# Patient Record
Sex: Female | Born: 1985 | Race: Black or African American | Hispanic: No | Marital: Married | Smoking: Never smoker
Health system: Southern US, Community
[De-identification: ages and names within clinical notes are randomized; demographics above are authoritative.]

## PROBLEM LIST (undated history)

## (undated) ENCOUNTER — Inpatient Hospital Stay (HOSPITAL_COMMUNITY): Payer: Self-pay

## (undated) DIAGNOSIS — K529 Noninfective gastroenteritis and colitis, unspecified: Secondary | ICD-10-CM

## (undated) DIAGNOSIS — O24429 Gestational diabetes mellitus in childbirth, unspecified control: Secondary | ICD-10-CM

## (undated) DIAGNOSIS — O24419 Gestational diabetes mellitus in pregnancy, unspecified control: Secondary | ICD-10-CM

## (undated) DIAGNOSIS — B999 Unspecified infectious disease: Secondary | ICD-10-CM

## (undated) DIAGNOSIS — F329 Major depressive disorder, single episode, unspecified: Secondary | ICD-10-CM

## (undated) DIAGNOSIS — Z8619 Personal history of other infectious and parasitic diseases: Secondary | ICD-10-CM

## (undated) DIAGNOSIS — F32A Depression, unspecified: Secondary | ICD-10-CM

## (undated) DIAGNOSIS — O139 Gestational [pregnancy-induced] hypertension without significant proteinuria, unspecified trimester: Secondary | ICD-10-CM

## (undated) DIAGNOSIS — R51 Headache: Secondary | ICD-10-CM

## (undated) HISTORY — PX: NO PAST SURGERIES: SHX2092

## (undated) HISTORY — DX: Personal history of other infectious and parasitic diseases: Z86.19

## (undated) HISTORY — DX: Morbid (severe) obesity due to excess calories: E66.01

## (undated) HISTORY — DX: Major depressive disorder, single episode, unspecified: F32.9

## (undated) HISTORY — DX: Gestational diabetes mellitus in pregnancy, unspecified control: O24.419

## (undated) HISTORY — DX: Depression, unspecified: F32.A

---
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
## 2005-01-16 ENCOUNTER — Emergency Department (HOSPITAL_COMMUNITY): Admission: EM | Admit: 2005-01-16 | Discharge: 2005-01-16 | Payer: Self-pay | Admitting: Family Medicine

## 2005-01-18 ENCOUNTER — Emergency Department (HOSPITAL_COMMUNITY): Admission: EM | Admit: 2005-01-18 | Discharge: 2005-01-18 | Payer: Self-pay | Admitting: Family Medicine

## 2006-02-26 ENCOUNTER — Other Ambulatory Visit: Admission: RE | Admit: 2006-02-26 | Discharge: 2006-02-26 | Payer: Self-pay | Admitting: Obstetrics and Gynecology

## 2010-02-08 ENCOUNTER — Ambulatory Visit: Payer: Self-pay

## 2010-02-14 ENCOUNTER — Inpatient Hospital Stay (HOSPITAL_COMMUNITY)
Admission: AD | Admit: 2010-02-14 | Discharge: 2010-02-14 | Disposition: A | Discharge status: Home / Self Care | Payer: 59 | Source: Ambulatory Visit | Attending: Obstetrics and Gynecology | Admitting: Obstetrics and Gynecology

## 2010-02-14 DIAGNOSIS — O4700 False labor before 37 completed weeks of gestation, unspecified trimester: Secondary | ICD-10-CM | POA: Insufficient documentation

## 2010-02-14 LAB — URINALYSIS, ROUTINE W REFLEX MICROSCOPIC
Bilirubin Urine: NEGATIVE
Hgb urine dipstick: NEGATIVE
Ketones, ur: 15 mg/dL — AB
Nitrite: NEGATIVE
Protein, ur: NEGATIVE mg/dL
Specific Gravity, Urine: 1.02 (ref 1.005–1.030)
Urine Glucose, Fasting: NEGATIVE mg/dL
Urobilinogen, UA: 0.2 mg/dL (ref 0.0–1.0)
pH: 6.5 (ref 5.0–8.0)

## 2010-02-14 LAB — FETAL FIBRONECTIN: Fetal Fibronectin: NEGATIVE

## 2010-02-15 LAB — GC/CHLAMYDIA PROBE AMP, GENITAL
Chlamydia, DNA Probe: NEGATIVE
GC Probe Amp, Genital: NEGATIVE

## 2010-02-20 ENCOUNTER — Ambulatory Visit: Payer: Self-pay | Admitting: *Deleted

## 2010-03-08 ENCOUNTER — Encounter: Payer: 59 | Attending: Obstetrics and Gynecology

## 2010-03-08 DIAGNOSIS — Z713 Dietary counseling and surveillance: Secondary | ICD-10-CM | POA: Insufficient documentation

## 2010-03-08 DIAGNOSIS — O99810 Abnormal glucose complicating pregnancy: Secondary | ICD-10-CM | POA: Insufficient documentation

## 2010-04-01 ENCOUNTER — Inpatient Hospital Stay (HOSPITAL_COMMUNITY)
Admission: AD | Admit: 2010-04-01 | Discharge: 2010-04-03 | DRG: 775 | Disposition: A | Discharge status: Home / Self Care | Payer: 59 | Source: Ambulatory Visit | Attending: Obstetrics and Gynecology | Admitting: Obstetrics and Gynecology

## 2010-04-01 ENCOUNTER — Other Ambulatory Visit: Payer: Self-pay | Admitting: Obstetrics and Gynecology

## 2010-04-01 DIAGNOSIS — E669 Obesity, unspecified: Secondary | ICD-10-CM | POA: Diagnosis present

## 2010-04-01 DIAGNOSIS — O99814 Abnormal glucose complicating childbirth: Secondary | ICD-10-CM | POA: Diagnosis present

## 2010-04-01 DIAGNOSIS — O99214 Obesity complicating childbirth: Secondary | ICD-10-CM | POA: Diagnosis present

## 2010-04-01 LAB — CBC
HCT: 38.7 % (ref 36.0–46.0)
Hemoglobin: 13.4 g/dL (ref 12.0–15.0)
MCH: 28.3 pg (ref 26.0–34.0)
MCHC: 34.6 g/dL (ref 30.0–36.0)
MCV: 81.8 fL (ref 78.0–100.0)
RBC: 4.73 MIL/uL (ref 3.87–5.11)
RDW: 14.7 % (ref 11.5–15.5)
WBC: 12.5 10*3/uL — ABNORMAL HIGH (ref 4.0–10.5)

## 2010-04-01 LAB — PLATELET COUNT: Platelets: 178 10*3/uL (ref 150–400)

## 2010-04-01 LAB — ABO/RH: ABO/RH(D): O POS

## 2010-04-01 LAB — RPR: RPR Ser Ql: NONREACTIVE

## 2010-04-02 LAB — CBC
MCH: 27.6 pg (ref 26.0–34.0)
MCHC: 33.2 g/dL (ref 30.0–36.0)
Platelets: 206 10*3/uL (ref 150–400)
RDW: 15 % (ref 11.5–15.5)

## 2010-04-03 LAB — GLUCOSE, CAPILLARY: Glucose-Capillary: 130 mg/dL — ABNORMAL HIGH (ref 70–99)

## 2010-04-09 ENCOUNTER — Inpatient Hospital Stay (HOSPITAL_COMMUNITY): Admission: AD | Admit: 2010-04-09 | Payer: Self-pay | Source: Home / Self Care | Admitting: Obstetrics and Gynecology

## 2010-04-13 NOTE — Discharge Summary (Signed)
Natalie [name], Natalie [name]                ACCOUNT NO.:  [id number]  MEDICAL RECORD NO.:  [id number]           PATIENT TYPE:  I  LOCATION:  9145                          FACILITY:  [facility]  PHYSICIAN:  Malachi Pro. Ambrose Mantle, M.D. DATE OF BIRTH:  [birth year]  DATE OF ADMISSION:  04/01/2010 DATE OF DISCHARGE:  04/03/2010                              DISCHARGE SUMMARY   A 25 year old black female para 0 gravida 1, EDC April 10, 2010, by ultrasound admitted in labor.  Blood group and type O positive, negative antibody, rubella immune, RPR nonreactive, urine culture negative, hepatitis B surface antigen negative, HIV negative, GC and Chlamydia negative, hemoglobin AA, first trimester screen negative, CF declined, 3- hour GTT 100, 181, 170, and 137, group B strep negative.  Ultrasound on August 29, 2009, estimated gestational age [redacted] weeks and 0 days, [facility] April 10, 2010.  Repeat ultrasound on November 08, 2009, estimated gestational age [redacted] weeks 6 days, [facility] April 12, 2010.  Prenatal care was uncomplicated except for massive obesity and gestational diabetes mellitus that was diet-controlled.  She began contracting at 4 a.m. on the day of admission that became significantly painful at 10 a.m. cervix was 2 cm on the office and 4 cm in Maternity Admissions Unit.  PAST MEDICAL HISTORY:  No known allergies.  No operations.  Illnesses, migraines and obesity.  Alcohol, tobacco, and drugs none.  FAMILY HISTORY:  Father with an MI, coronary artery disease, and CVA. Uncle and aunt have diabetes and uncle has high blood pressure.  Mother with a thyroid problem.  Maternal grandmother with ovarian cancer.  PHYSICAL EXAMINATION ON ADMISSION:  VITAL SIGNS:  Normal except for pulse of 116. HEART:  Normal sinus tachycardia.  No murmurs. LUNGS:  Clear to auscultation. ABDOMEN:  Soft.  Fundal height has been 40 cm on March 28, 2010.  Fetal heart tones were in the 160s.  Cervix 5 cm, 100%, vertex at -2. Artificial rupture of  membranes produced thick, dark meconium-stained fluid.  IMPRESSION ON ADMISSION: 1. Intrauterine pregnancy at 38 weeks and 6 days. 2. Meconium-stained fluid. 3. Gestational diabetes mellitus. 4. Fetal tachycardia.  The patient requested an epidural.  It was difficult to get blood from the patient and consequently, there was a long delay in discovering that no platelet count could be done.  A second blood draw was done but the patient progressed rapidly.  She delivered before she could be placed in stirrups.  The infant was a 6-pound and 3-ounce female with Apgars of 8 at 1 and 9 at 5 minutes, and I delivered the baby, the baby smelled foul.  The nose and pharynx were suctioned with bulb on the patient's bed.  Dr. Mikle Bosworth, the neonatologist, was present at 1 minute of age. Placenta was removed intact.  Uterus was normal.  Bilateral lacerations at 3 and 9 o'clock on the inner labia were bleeding and sutured with 3-0 Vicryl under local block.  Blood loss about 400 mL.  The baby was observed in the regular nursery.  Postpartum, the patient did well.  She never became febrile.  The baby did well.  Remained  in the patient's room and on the second postpartum day, the patient was ready for discharge.  Capillary blood glucose was done and was 130.  Initial hemoglobin 13.4, hematocrit 38.7, white count 12,500, platelet count 178,000.  Followup hemoglobin 11.9, blood group and type O positive, RPR nonreactive.  FINAL DIAGNOSES: 1. Intrauterine pregnancy at 38 weeks and 6 days, delivered, vertex. 2. Meconium-stained fluid. 3. Gestational diabetes mellitus. 4. Fetal tachycardia. 5. Evidence of chorioamnionitis at birth.  OPERATIONS: 1. Spontaneous delivery, vertex. 2. Repair of bilateral labial lacerations at 3 and 9 o'clock.  FINAL CONDITION:  Improved.  INSTRUCTIONS:  Include our regular discharge instruction booklet.  The patient was given Motrin 600 mg, 30 tablets, 1 every 6 hours as  needed for pain with 1 refill.  She declines contraception at discharge and is advised to return in 6 weeks for followup examination.     Malachi Pro. Ambrose Mantle, M.D.     TFH/MEDQ  D:  04/03/2010  T:  04/04/2010  Job:  [id number]  Electronically Signed by Tracey Harries M.D. on 04/13/2010 08:50:37 AM

## 2012-01-09 NOTE — L&D Delivery Note (Signed)
Delivery Note  First Stage: Labor onset: 1000 Augmentation : AROM Analgesia /Anesthesia intrapartum: nubaine AROM at 1600, clear  Second Stage: Complete dilation at 2155 Onset of pushing at 2155 FHR second stage 145 baseline/ moderate variability/ no accels/ variables with pushing  Delivery of a viable female infant at 2217 by CNM in LOA position No nuchal cord Cord double clamped after cessation of pulsation, cut by patient's mother Cord blood sample collected    Third Stage: Placenta delivered spontaneously intact with 3 VC @ 2245 Placenta disposition: Intact, Schultz Uterine tone firm U/U / bleeding light  No laceration identified  Anesthesia for repair: N/A Repair N/A Est. Blood Loss (mL): 150  Complications: None  Mom to postpartum.  Baby to Couplet care / Skin to Skin.  Newborn: Birth Weight: 3555 g (7 lb 13.4 oz) Apgar Scores: 7 / 9 Feeding planned: Breast  Cyndee Brightly SNM 11/26/2012, 10:51 PM  Direct supervision for birth - Marlinda Mike CNM [facility]

## 2012-04-15 LAB — OB RESULTS CONSOLE ANTIBODY SCREEN: Antibody Screen: NEGATIVE

## 2012-04-15 LAB — OB RESULTS CONSOLE ABO/RH

## 2012-04-15 LAB — OB RESULTS CONSOLE HEPATITIS B SURFACE ANTIGEN: Hepatitis B Surface Ag: NEGATIVE

## 2012-04-15 LAB — OB RESULTS CONSOLE HIV ANTIBODY (ROUTINE TESTING): HIV: NONREACTIVE

## 2012-04-15 LAB — OB RESULTS CONSOLE RUBELLA ANTIBODY, IGM: Rubella: IMMUNE

## 2012-08-12 ENCOUNTER — Other Ambulatory Visit: Payer: Self-pay

## 2012-08-14 ENCOUNTER — Other Ambulatory Visit (HOSPITAL_COMMUNITY): Payer: Self-pay | Admitting: Obstetrics and Gynecology

## 2012-08-14 DIAGNOSIS — O10919 Unspecified pre-existing hypertension complicating pregnancy, unspecified trimester: Secondary | ICD-10-CM

## 2012-08-20 ENCOUNTER — Ambulatory Visit (HOSPITAL_COMMUNITY)
Admission: RE | Admit: 2012-08-20 | Discharge: 2012-08-20 | Disposition: A | Discharge status: Home / Self Care | Payer: 59 | Source: Ambulatory Visit | Attending: Obstetrics and Gynecology | Admitting: Obstetrics and Gynecology

## 2012-08-20 ENCOUNTER — Encounter (HOSPITAL_COMMUNITY): Payer: Self-pay

## 2012-08-20 DIAGNOSIS — Z363 Encounter for antenatal screening for malformations: Secondary | ICD-10-CM | POA: Insufficient documentation

## 2012-08-20 DIAGNOSIS — O10019 Pre-existing essential hypertension complicating pregnancy, unspecified trimester: Secondary | ICD-10-CM | POA: Insufficient documentation

## 2012-08-20 DIAGNOSIS — E669 Obesity, unspecified: Secondary | ICD-10-CM | POA: Insufficient documentation

## 2012-08-20 DIAGNOSIS — O162 Unspecified maternal hypertension, second trimester: Secondary | ICD-10-CM

## 2012-08-20 DIAGNOSIS — Z1389 Encounter for screening for other disorder: Secondary | ICD-10-CM | POA: Insufficient documentation

## 2012-08-20 DIAGNOSIS — E119 Type 2 diabetes mellitus without complications: Secondary | ICD-10-CM

## 2012-08-20 DIAGNOSIS — O358XX0 Maternal care for other (suspected) fetal abnormality and damage, not applicable or unspecified: Secondary | ICD-10-CM | POA: Insufficient documentation

## 2012-08-20 DIAGNOSIS — O10919 Unspecified pre-existing hypertension complicating pregnancy, unspecified trimester: Secondary | ICD-10-CM

## 2012-08-20 DIAGNOSIS — O24919 Unspecified diabetes mellitus in pregnancy, unspecified trimester: Secondary | ICD-10-CM | POA: Insufficient documentation

## 2012-08-20 NOTE — Progress Notes (Signed)
[facility] ultrasound  Indication: 27 yr old G2P1001 at [redacted]w[redacted]d with type II diabetes and chronic hypertension for fetal anatomic survey.  Findings: 1. Single intrauterine pregnancy. 2. Estimated fetal weight is in the 38th%. 3. Anterior placenta without evidence of previa. 4. Normal amniotic fluid volume. 5. Normal transabdominal cervical length. 6. The anatomy survey is extremely limited by maternal body habitus; no abnormalities were seen.  Recommendations: 1. Appropriate fetal growth. 2. Limited anatomy survey: - recommend follow up in 4 weeks to reattempt anatomic survey 3. Type II diabetes: - see consult letter - recommend fetal echocardiogram asap (scheduled) - recommend fetal growth every 4 weeks - recommend antenatal testing starting at [redacted] weeks gestation - recommend delivery by estimated due date 4. Hypertension: - see consult letter - on labetalol - recommend fetal growth and antenatal testing as above - recommend delivery by estimated due date but not prior to 38 weeks in the absence of other complications  Eulis Foster, MD

## 2012-08-20 NOTE — Consult Note (Signed)
MFM consult  27 yr old G2P1001 at [redacted]w[redacted]d with type II diabetes and chronic hypertension referred by Dr. Cherly Hensen for fetal anatomic survey and consult.  Past OB hx: full term vaginal delivery; complicated by gestational diabetes  PMH: type II diabetes- diagnosed early this pregnancy with elevated hgbA1C Chronic hypertension- diagnosed early this pregnancy  Ultrasound today shows: estimated fetal weight is in the 38th%. Anterior placenta without evidence of previa. Normal amniotic fluid volume. Normal transabdominal cervical length. The anatomy survey is extremely limited by maternal body habitus; no abnormalities were seen.  I discussed the findings of the ultrasound with the patient and counseled her as follows:  1. Appropriate fetal growth. 2. Limited anatomy survey: - recommend follow up in 4 weeks to reattempt anatomic survey - discussed limitations of ultrasound in detecting fetal anomalies and that we may not be able to clear anatomy given maternal body habitus but will reattempt 3. Type II diabetes: Discussed increased risks in pregnancy include: fetal macrosomia, shoulder dystocia, and increased risk of requiring a Cesarean delivery. There is also an increased risk of developing preeclampsia during the pregnancy. There is an increased risk of congenital malformations related to level of diabetic control in the first trimester. I discussed there is an increased risk of stillbirth, neonatal hypoglycemia, neonatal jaundice, and neonatal electrolyte disturbances. I recommend strict glucose control maintaining fasting blood sugars <90 and 2 hour postprandial values <120 or 1 hour values <140. Recommend patient check blood sugars 4x/day- fasting and 1 or 2 hours postprandial (currently is only checking fasting levels- patient reports values range from 80-100s). Patient is on glyburide 2.5mg  bid. Discussed needs to report her blood sugars weekly and medication should be adjusted to achieve the  above control. I recommend starting fetal kick counts- at [redacted] weeks gestation. I recommend starting antenatal testing with either weekly biophysical profiles or twice weekly nonstress tests and weekly amniotic fluid index starting at 32 weeks.  I recommend following fetal growth every 4 weeks. Patient had normal baseline 24 hour urine, CBC, and LFTs. Recommend check hemoglobin A1C every trimester.  Given the increased risk of congenital anomalies recommend fetal echocardiogram as soon as possible- the referral was made. The anatomy survey is limited on today's exam- recommend follow up in 4 weeks to complete anatomy. - recommend delivery by estimated due date 4. Hypertension: I discussed that women with preexistent hypertension are at increased risk of adverse pregnancy outcome, including preeclampsia, abruption, fetal growth restriction, and perinatal death. The risk increases with severity of hypertension and presence of end-organ damage. Risk of superimposed preeclampsia is 10 to 25 %; risk of abruption is 0.7 to 1.5 %; and risk of fetal growth restriction is 8 to 16 %. I also discussed that risk of preterm delivery is increased and is usually iatrogenic from the complications listed above. Risk of preterm delivery in women with chronic hypertension is 12-34%. There is also an increased risk of requiring C section for the above reasons. Patient' hypertension is currently managed with labetalol. We discussed this is a category C medication and is considered safe in pregnancy but may have an increased risk of fetal growth restriction. I would recommend targeting therapy to keep systolic blood pressures <150 and diastolic blood pressures <100. Patient had normal baseline 24 hour urine, CBC, and LFTs. I recommend serial ultrasounds for fetal growth every 4 weeks. I recommend close surveillance for the development of signs/symptoms of preeclampsia especially in the third trimester. I recommend antenatal  testing to start at  [redacted] weeks gestation. - recommend delivery by estimated due date but not prior to 38 weeks in the absence of other complications  I spent 30 minutes in face to face consultation with the patient in addition to time spent on the ultrasound.  Eulis Foster, MD

## 2012-08-27 DIAGNOSIS — IMO0002 Reserved for concepts with insufficient information to code with codable children: Secondary | ICD-10-CM | POA: Insufficient documentation

## 2012-09-17 ENCOUNTER — Ambulatory Visit (HOSPITAL_COMMUNITY): Payer: 59

## 2012-11-25 ENCOUNTER — Encounter (HOSPITAL_COMMUNITY): Payer: Self-pay | Admitting: *Deleted

## 2012-11-25 ENCOUNTER — Telehealth (HOSPITAL_COMMUNITY): Payer: Self-pay | Admitting: *Deleted

## 2012-11-25 NOTE — Telephone Encounter (Signed)
Preadmission screen  

## 2012-11-26 ENCOUNTER — Inpatient Hospital Stay (HOSPITAL_COMMUNITY)
Admission: AD | Admit: 2012-11-26 | Discharge: 2012-11-28 | DRG: 774 | Disposition: A | Discharge status: Home / Self Care | Payer: 59 | Source: Ambulatory Visit | Attending: Obstetrics & Gynecology | Admitting: Obstetrics & Gynecology

## 2012-11-26 ENCOUNTER — Encounter (HOSPITAL_COMMUNITY): Payer: Self-pay | Admitting: *Deleted

## 2012-11-26 DIAGNOSIS — O1002 Pre-existing essential hypertension complicating childbirth: Secondary | ICD-10-CM | POA: Diagnosis present

## 2012-11-26 DIAGNOSIS — I10 Essential (primary) hypertension: Secondary | ICD-10-CM | POA: Diagnosis present

## 2012-11-26 DIAGNOSIS — O24429 Gestational diabetes mellitus in childbirth, unspecified control: Secondary | ICD-10-CM | POA: Diagnosis present

## 2012-11-26 DIAGNOSIS — O4100X0 Oligohydramnios, unspecified trimester, not applicable or unspecified: Secondary | ICD-10-CM | POA: Diagnosis present

## 2012-11-26 DIAGNOSIS — E669 Obesity, unspecified: Secondary | ICD-10-CM | POA: Diagnosis present

## 2012-11-26 DIAGNOSIS — O99814 Abnormal glucose complicating childbirth: Principal | ICD-10-CM | POA: Diagnosis present

## 2012-11-26 HISTORY — DX: Gestational diabetes mellitus in childbirth, unspecified control: O24.429

## 2012-11-26 LAB — TYPE AND SCREEN
ABO/RH(D): O POS
Antibody Screen: NEGATIVE

## 2012-11-26 LAB — COMPREHENSIVE METABOLIC PANEL
ALT: 8 U/L (ref 0–35)
AST: 18 U/L (ref 0–37)
Albumin: 2.8 g/dL — ABNORMAL LOW (ref 3.5–5.2)
Alkaline Phosphatase: 101 U/L (ref 39–117)
BUN: 5 mg/dL — ABNORMAL LOW (ref 6–23)
CO2: 16 mEq/L — ABNORMAL LOW (ref 19–32)
Calcium: 9.9 mg/dL (ref 8.4–10.5)
Chloride: 105 mEq/L (ref 96–112)
Creatinine, Ser: 0.63 mg/dL (ref 0.50–1.10)
GFR calc Af Amer: >90 mL/min (ref >90)
GFR calc non Af Amer: >90 mL/min (ref >90)
Glucose, Bld: 88 mg/dL (ref 70–99)
Potassium: 4 mEq/L (ref 3.5–5.1)
Sodium: 133 mEq/L — ABNORMAL LOW (ref 135–145)
Total Bilirubin: 0.4 mg/dL (ref 0.3–1.2)
Total Protein: 6.7 g/dL (ref 6.0–8.3)

## 2012-11-26 LAB — CBC
HCT: 35.4 % — ABNORMAL LOW (ref 36.0–46.0)
Hemoglobin: 12.1 g/dL (ref 12.0–15.0)
MCH: 27.8 pg (ref 26.0–34.0)
MCHC: 34.2 g/dL (ref 30.0–36.0)
MCV: 81.4 fL (ref 78.0–100.0)
Platelets: 161 10*3/uL (ref 150–400)
RBC: 4.35 MIL/uL (ref 3.87–5.11)
RDW: 15.6 % — ABNORMAL HIGH (ref 11.5–15.5)
WBC: 7.6 10*3/uL (ref 4.0–10.5)

## 2012-11-26 LAB — RPR: RPR Ser Ql: NONREACTIVE

## 2012-11-26 MED ORDER — OXYTOCIN 10 UNIT/ML IJ SOLN
10.0000 [IU] | Freq: Once | INTRAMUSCULAR | Status: AC
  Administered 2012-11-26: 10 [IU] via INTRAMUSCULAR
  Filled 2012-11-26: qty 1

## 2012-11-26 MED ORDER — LIDOCAINE HCL (PF) 1 % IJ SOLN
30.0000 mL | INTRAMUSCULAR | Status: DC | PRN
Start: 2012-11-26 — End: 2012-11-28
  Filled 2012-11-26 (×2): qty 30

## 2012-11-26 MED ORDER — CITRIC ACID-SODIUM CITRATE 334-500 MG/5ML PO SOLN: 30.0000 mL | ORAL | Status: DC | PRN

## 2012-11-26 MED ORDER — ACETAMINOPHEN 325 MG PO TABS: 650.0000 mg | ORAL_TABLET | ORAL | Status: DC | PRN

## 2012-11-26 MED ORDER — NALBUPHINE HCL 10 MG/ML IJ SOLN
10.0000 mg | INTRAMUSCULAR | Status: AC
  Administered 2012-11-26: 10 mg via SUBCUTANEOUS
  Filled 2012-11-26: qty 1

## 2012-11-26 MED ORDER — IBUPROFEN 600 MG PO TABS: 600.0000 mg | ORAL_TABLET | Freq: Four times a day (QID) | ORAL | Status: DC | PRN

## 2012-11-26 NOTE — Progress Notes (Signed)
Baby girl Natalie [name] blood sugar 38 taken after one hour of birth. Baby to mothers breast. Called nursery to inform of baby girl Natalie [name] blood sugar

## 2012-11-26 NOTE — Progress Notes (Signed)
Delivery Note  First Stage: Labor onset: 1000 Augmentation : AROM Analgesia /Anesthesia intrapartum: nubaine AROM at 1600, clear  Second Stage: Complete dilation at 2155 Onset of pushing at 2155 FHR second stage 145 baseline/ moderate variability/ no accels/ variables with pushing  Delivery of a viable female infant at 2217 by CNM in LOA position No nuchal cord Cord double clamped after cessation of pulsation, cut by patient's mother Cord blood sample collected    Third Stage: Placenta delivered spontaneously intact with 3 VC @ 2245 Placenta disposition: Intact, Schultz Uterine tone firm U/U / bleeding light  No laceration identified  Anesthesia for repair: N/A Repair N/A Est. Blood Loss (mL): 150  Complications: None  Mom to postpartum.  Baby to Couplet care / Skin to Skin.  Newborn: Birth Weight: 3555 g (7 lb 13.4 oz) Apgar Scores: 7 / 9 Feeding planned: Breast  Cyndee Brightly SNM 11/26/2012, 10:51 PM

## 2012-11-26 NOTE — H&P (Signed)
  OB ADMISSION/ HISTORY & PHYSICAL:  Admission Date: 11/26/2012  2:56 PM  Admit Diagnosis: 39.4 weeks / labor / GDMa2  well -controlled / hypertension  Natalie [name] is a 27 y.o. female presenting for onset of labor and oligohydramnios at office  Prenatal History: G2P1001   EDC : 11/29/2012, by Ultrasound  Prenatal care at [facility]  Primary Ob Provider: Marlinda Mike CNM Prenatal course complicated by morbid obesity / hypertension / GDMa2   EFW 6-7 pounds / AFI 5.0 / BPP 8-8 / vtx  Prenatal Labs: ABO, Rh: O/Positive/-- (04/08 0000) Antibody: Negative (04/08 0000) Rubella: Immune (04/08 0000)  RPR: Nonreactive (04/08 0000)  HBsAg: Negative (04/08 0000)  HIV: Non-reactive (04/08 0000)  GTT: ABNORMAL GBS: Negative (10/27 0000)   Medical / Surgical History :  Past medical history:  Past Medical History  Diagnosis Date  . Depression   . Morbid obesity   . Hx of varicella   . Hypertension   . Gestational diabetes      Past surgical history:  Past Surgical History  Procedure Laterality Date  . No past surgeries      Family History:  Family History  Problem Relation Age of Onset  . Hypothyroidism Mother   . Asthma Mother   . Bipolar disorder Mother   . Stroke Father   . Hypertension Father   . Seizures Father   . Asthma Sister   . Asthma Brother   . Asthma Daughter   . Diabetes Maternal Uncle   . Diabetes Maternal Grandmother   . Cancer Maternal Grandmother     breast  . Heart disease Paternal Grandfather   . Cancer Paternal Grandfather   . Heart attack Paternal Grandfather      Social History:  reports that she has never smoked. She has never used smokeless tobacco. She reports that she does not drink alcohol or use illicit drugs.  Allergies: Review of patient's allergies indicates no known allergies.   Current Medications at time of admission:  Prior to Admission medications   Not on File    Review of Systems: Active FM onset  of ctx @ 0300 currently every 3 minutes No LOF bloody show pink discharge  Physical Exam:  VS: Blood pressure 122/82, pulse 131, temperature 98.2 F (36.8 C), temperature source Oral, resp. rate 20, height 5\' 2"  (1.575 m), weight 146.965 kg (324 lb), last menstrual period 02/15/2012.  General: alert and oriented, appears uncomfortable with ctx Heart: RRR Lungs: Clear lung fields Abdomen: Gravid, soft and non-tender, non-distended / uterus: gravid and non-tender Extremities: trace edema  Genitalia / VE: Dilation: 5 Effacement (%): 90 Station: -1 Exam by:: Dezi Brauner, cnm  FHR: baseline rate 155 / variability moderate / accelerations + / no decelerations TOCO: 2-4 minutes  Assessment: 39.[redacted]  weeks gestation / GdMa2 well controlled with AGA fetus / hypertension controlled on Labetalol active stage of labor FHR category 1   Plan:  No IV - prefers no analgesia or intervention unless necessary Expectant management after AROM Anticipate SVB  Dr Ernestina Penna notified of admission / plan of care   Marlinda Mike CNM, MSN, [facility] 11/26/2012, 4:06 PM

## 2012-11-26 NOTE — Progress Notes (Signed)
S:  Painful ctx - requesting medication  O:  VS: Blood pressure 139/66, pulse 121, temperature 98.1 F (36.7 C), temperature source Oral, resp. rate 20, height 5\' 2"  (1.575 m), weight 146.965 kg (324 lb), last menstrual period 02/15/2012.        FHR : baseline 150 / variability moderate / accelerations + / no decelerations        Toco: contractions every 3-4 minutes / moderate to strong         Cervix : 8/90/ vtx / 0 ROT        Membranes: clear fluid  A: active labor     FHR category 1  P: nubain sub Q now     Anticipate SVB in next 2 hours     Marlinda Mike CNM, MSN, [facility] 11/26/2012, 8:36 PM

## 2012-11-27 ENCOUNTER — Encounter (HOSPITAL_COMMUNITY): Payer: Self-pay

## 2012-11-27 DIAGNOSIS — I10 Essential (primary) hypertension: Secondary | ICD-10-CM | POA: Insufficient documentation

## 2012-11-27 LAB — COMPREHENSIVE METABOLIC PANEL
ALT: 11 U/L (ref 0–35)
AST: 30 U/L (ref 0–37)
Albumin: 2.6 g/dL — ABNORMAL LOW (ref 3.5–5.2)
Alkaline Phosphatase: 99 U/L (ref 39–117)
BUN: 6 mg/dL (ref 6–23)
CO2: 14 mEq/L — ABNORMAL LOW (ref 19–32)
Calcium: 9.6 mg/dL (ref 8.4–10.5)
Chloride: 104 mEq/L (ref 96–112)
Creatinine, Ser: 0.67 mg/dL (ref 0.50–1.10)
GFR calc Af Amer: >90 mL/min (ref >90)
GFR calc non Af Amer: >90 mL/min (ref >90)
Glucose, Bld: 153 mg/dL — ABNORMAL HIGH (ref 70–99)
Potassium: 4.3 mEq/L (ref 3.5–5.1)
Sodium: 133 mEq/L — ABNORMAL LOW (ref 135–145)
Total Bilirubin: 0.5 mg/dL (ref 0.3–1.2)
Total Protein: 5.9 g/dL — ABNORMAL LOW (ref 6.0–8.3)

## 2012-11-27 LAB — CBC
HCT: 34.7 % — ABNORMAL LOW (ref 36.0–46.0)
Hemoglobin: 11.6 g/dL — ABNORMAL LOW (ref 12.0–15.0)
MCH: 27.1 pg (ref 26.0–34.0)
MCHC: 33.4 g/dL (ref 30.0–36.0)
MCV: 81.1 fL (ref 78.0–100.0)
Platelets: 179 10*3/uL (ref 150–400)
RBC: 4.28 MIL/uL (ref 3.87–5.11)
RDW: 15.5 % (ref 11.5–15.5)
WBC: 12.4 10*3/uL — ABNORMAL HIGH (ref 4.0–10.5)

## 2012-11-27 LAB — GLUCOSE, CAPILLARY: Glucose-Capillary: 122 mg/dL — ABNORMAL HIGH (ref 70–99)

## 2012-11-27 MED ORDER — DIPHENHYDRAMINE HCL 25 MG PO CAPS: 25.0000 mg | ORAL_CAPSULE | Freq: Four times a day (QID) | ORAL | Status: DC | PRN

## 2012-11-27 MED ORDER — ONDANSETRON HCL 4 MG/2ML IJ SOLN: 4.0000 mg | INTRAMUSCULAR | Status: DC | PRN

## 2012-11-27 MED ORDER — SIMETHICONE 80 MG PO CHEW: 80.0000 mg | CHEWABLE_TABLET | ORAL | Status: DC | PRN

## 2012-11-27 MED ORDER — IBUPROFEN 600 MG PO TABS
600.0000 mg | ORAL_TABLET | Freq: Four times a day (QID) | ORAL | Status: DC
  Administered 2012-11-27 – 2012-11-28 (×7): 600 mg via ORAL
  Filled 2012-11-27 (×7): qty 1

## 2012-11-27 MED ORDER — ONDANSETRON HCL 4 MG PO TABS: 4.0000 mg | ORAL_TABLET | ORAL | Status: DC | PRN

## 2012-11-27 MED ORDER — PRENATAL MULTIVITAMIN CH
1.0000 | ORAL_TABLET | Freq: Every day | ORAL | Status: DC
  Administered 2012-11-27 – 2012-11-28 (×2): 1 via ORAL
  Filled 2012-11-27 (×2): qty 1

## 2012-11-27 MED ORDER — DIBUCAINE 1 % RE OINT: 1.0000 "application " | TOPICAL_OINTMENT | RECTAL | Status: DC | PRN

## 2012-11-27 MED ORDER — SENNOSIDES-DOCUSATE SODIUM 8.6-50 MG PO TABS
2.0000 | ORAL_TABLET | ORAL | Status: DC
  Filled 2012-11-27: qty 2

## 2012-11-27 MED ORDER — LANOLIN HYDROUS EX OINT: TOPICAL_OINTMENT | CUTANEOUS | Status: DC | PRN

## 2012-11-27 MED ORDER — WITCH HAZEL-GLYCERIN EX PADS: 1.0000 "application " | MEDICATED_PAD | CUTANEOUS | Status: DC | PRN

## 2012-11-27 MED ORDER — HYDROCORTISONE ACE-PRAMOXINE 1-1 % RE CREA
TOPICAL_CREAM | Freq: Three times a day (TID) | RECTAL | Status: DC
  Filled 2012-11-27: qty 30

## 2012-11-27 MED ORDER — BENZOCAINE-MENTHOL 20-0.5 % EX AERO: 1.0000 "application " | INHALATION_SPRAY | CUTANEOUS | Status: DC | PRN

## 2012-11-27 MED ORDER — LABETALOL HCL 100 MG PO TABS
100.0000 mg | ORAL_TABLET | Freq: Two times a day (BID) | ORAL | Status: DC
  Administered 2012-11-27 – 2012-11-28 (×3): 100 mg via ORAL
  Filled 2012-11-27 (×6): qty 1

## 2012-11-27 MED ORDER — OXYCODONE-ACETAMINOPHEN 5-325 MG PO TABS
1.0000 | ORAL_TABLET | ORAL | Status: DC | PRN
  Administered 2012-11-27: 1 via ORAL
  Filled 2012-11-27 (×2): qty 1

## 2012-11-27 NOTE — Progress Notes (Signed)
PPD #1- SVD  Subjective:   Reports feeling good Tolerating po/ No nausea or vomiting No HA, visual changes, or epigastric pain Bleeding is light, moderate Pain controlled with Motrin Up ad lib / ambulatory / voiding without problems Newborn: breastfeeding     Objective:   VS:  VS:  Filed Vitals:   11/27/12 0030 11/27/12 0103 11/27/12 0130 11/27/12 0530  BP: 136/84 136/84 132/78 112/71  Pulse: 111 111 120 120  Temp: 98.4 F (36.9 C)  98.3 F (36.8 C) 98.2 F (36.8 C)  TempSrc:      Resp: 18  18 18   Height:      Weight:        LABS:  Recent Labs  11/26/12 1620 11/27/12 0545  WBC 7.6 12.4*  HGB 12.1 11.6*  PLT 161 179   Blood type: --/--/O POS (11/19 1620) Rubella: Immune (04/08 0000)   I&O: Intake/Output     11/19 0701 - 11/20 0700 11/20 0701 - 11/21 0700   Blood 150    Total Output 150     Net -150          Urine Occurrence 1 x      Physical Exam: Alert and oriented x3 Abdomen: soft, non-tender, non-distended  Fundus: firm, non-tender, U-1 Perineum: intact, no edema Lochia: small-mod Extremities: 2+ edema to feet, no calf pain or tenderness, Homans neg    Assessment:  PPD # 1G2P2002/ S/P:induced vaginal  Chronic Hypertension-stable A2GDM, delivered  Doing well    Plan: Continue routine post partum orders Monitor FBS and 2 hr postprandial; BS this am was 3 hrs after meal (not fasting) Continue Labetalol 100 mg bid Anticipate D/C home in AM   Trek Kimball, N MSN, CNM 11/27/2012, 9:36 AM

## 2012-11-27 NOTE — Progress Notes (Signed)
Present for birth - direct supervision - Marlinda Mike CNM [facility]

## 2012-11-27 NOTE — Progress Notes (Signed)
Patient was referred for history of depression/anxiety.  * Referral screened out by Clinical Social Worker because none of the following criteria appear to apply:  ~ History of anxiety/depression during this pregnancy, or of post-partum depression.  ~ Diagnosis of anxiety and/or depression within last 3 years  ~ History of depression due to pregnancy loss/loss of child  OR  * Patient's symptoms currently being treated with medication and/or therapy.  Please contact the Clinical Social Worker if needs arise, or by the patient's request.  Pt experienced situational depression in 2012. She denies any symptoms since then & reports feeling fine.

## 2012-11-27 NOTE — Progress Notes (Signed)
Notified Dr. Juliene Pina that morning dose of labetalol was not given this am, notified of B/P and increased heart rate. Orders to give labetalol unless B/P is less than 110/70. Pt notified of MD orders.

## 2012-11-28 ENCOUNTER — Encounter (HOSPITAL_COMMUNITY): Payer: Self-pay | Admitting: Obstetrics and Gynecology

## 2012-11-28 ENCOUNTER — Inpatient Hospital Stay (HOSPITAL_COMMUNITY): Admission: RE | Admit: 2012-11-28 | Payer: 59 | Source: Ambulatory Visit

## 2012-11-28 DIAGNOSIS — O24429 Gestational diabetes mellitus in childbirth, unspecified control: Secondary | ICD-10-CM

## 2012-11-28 HISTORY — DX: Gestational diabetes mellitus in childbirth, unspecified control: O24.429

## 2012-11-28 LAB — GLUCOSE, CAPILLARY
Glucose-Capillary: 153 mg/dL — ABNORMAL HIGH (ref 70–99)
Glucose-Capillary: 110 mg/dL — ABNORMAL HIGH (ref 70–99)
Glucose-Capillary: 131 mg/dL — ABNORMAL HIGH (ref 70–99)

## 2012-11-28 MED ORDER — IBUPROFEN 600 MG PO TABS: 600.0000 mg | ORAL_TABLET | Freq: Four times a day (QID) | ORAL | Status: DC

## 2012-11-28 MED ORDER — HYDROCORTISONE ACE-PRAMOXINE 1-1 % RE CREA: TOPICAL_CREAM | Freq: Three times a day (TID) | RECTAL | Status: DC

## 2012-11-28 NOTE — Plan of Care (Signed)
Problem: Consults Goal: Diabetes Guidelines if Diabetic/Glucose > 140 If diabetic or lab glucose is > 140 mg/dl - Initiate Diabetes/Hyperglycemia Guidelines & Document Interventions  Outcome: Progressing CBG pp and Fasting CBG in AMs

## 2012-11-28 NOTE — Discharge Summary (Signed)
Obstetric Discharge Summary Reason for Admission: induction of labor Prenatal Procedures: NST and ultrasound Intrapartum Procedures: spontaneous vaginal delivery Postpartum Procedures: none Complications-Operative and Postpartum: none Hemoglobin  Date Value Range Status  11/27/2012 11.6* 12.0 - 15.0 g/dL Final     HCT  Date Value Range Status  11/27/2012 34.7* 36.0 - 46.0 % Final    Physical Exam:  General: alert, cooperative, no distress and morbidly obese Lochia: appropriate Uterine Fundus: firm, midline - difficult assessment d/t maternal habitus DVT Evaluation: No evidence of DVT seen on physical exam. Negative Homan's sign. No cords or calf tenderness. Calf/Ankle 2+ edema is present.  Discharge Diagnoses: Term Pregnancy-delivered        Gestational Diabetes Mellitus Class A1        Chronic Hypertension - stable on Labetalol  Discharge Information: Date: 11/28/2012 Activity: pelvic rest Diet: Reduced Sodium Diet Medications: PNV, Ibuprofen and Labetalol Condition: stable Instructions: refer to practice specific booklet Discharge to: home Follow-up Information   Follow up with Marlinda Mike, CNM. Schedule an appointment as soon as possible for a visit in 2 weeks.   Specialty:  Obstetrics and Gynecology   Contact information:   [address] [place] [address] [phone]       Follow up with Marlinda Mike, CNM. Schedule an appointment as soon as possible for a visit in 6 weeks.   Specialty:  Obstetrics and Gynecology   Contact information:   [address] [place] [address] [phone]       Newborn Data: Live born female on 11/26/2012 Birth Weight: 7 lb 13.4 oz (3555 g) APGAR: 7, 9  Home with mother.  Kenard Gower, MSN, CNM 11/28/2012, 11:27 AM

## 2012-11-28 NOTE — Progress Notes (Signed)
Patient ID: Natalie [name], female   DOB: 1985/03/14, 27 y.o.   MRN: [id number] Post Partum Day #2            Information for the patient's newborn:  Aysel, Gilchrest [[id number]]  female  Feeding: breast  Subjective: No HA, SOB, CP, F/C, breast symptoms. Pain well-controlled with ibuprofen. Normal vaginal bleeding, no clots.      Objective:  Temp:  [97.4 F (36.3 C)-98.3 F (36.8 C)] 97.4 F (36.3 C) (11/21 0612) Pulse Rate:  [104-125] 120 (11/21 1113) Resp:  [16-20] 20 (11/21 0612) BP: (116-145)/(56-91) 116/56 mmHg (11/21 1113) SpO2:  [97 %-98 %] 98 % (11/20 2153)      Recent Labs  11/26/12 1620 11/27/12 0545  WBC 7.6 12.4*  HGB 12.1 11.6*  HCT 35.4* 34.7*  PLT 161 179    Blood type: --/--/O POS (11/19 1620) Rubella: Immune (04/08 0000)    Physical Exam:  General: alert, cooperative and no distress Uterine Fundus: firm, midline Lochia: appropriate Perineum: intact DVT Evaluation: No evidence of DVT seen on physical exam. Negative Homan's sign. No cords or calf tenderness. Calf/Ankle 2+ edema is present.    Assessment/Plan: PPD # 2 / 27 y.o., [id number] S/P: induced vaginal d/t oligohydramnios   Active Problems:   Postpartum care following vaginal delivery (11/19)   Hypertension   Gestational diabetes mellitus, delivered   Morbid Obesity   Normal postpartum exam  Continue current postpartum care  D/C home   LOS: 2 days   Arita Miss, Dandy Lazaro, M, MSN, CNM 11/28/2012, 11:18 AM

## 2013-03-01 ENCOUNTER — Encounter (HOSPITAL_COMMUNITY): Payer: Self-pay | Admitting: Emergency Medicine

## 2013-03-01 ENCOUNTER — Emergency Department (HOSPITAL_COMMUNITY)
Admission: EM | Admit: 2013-03-01 | Discharge: 2013-03-01 | Disposition: A | Discharge status: Home / Self Care | Payer: 59 | Source: Home / Self Care | Attending: Family Medicine | Admitting: Family Medicine

## 2013-03-01 DIAGNOSIS — L089 Local infection of the skin and subcutaneous tissue, unspecified: Secondary | ICD-10-CM

## 2013-03-01 DIAGNOSIS — L723 Sebaceous cyst: Secondary | ICD-10-CM

## 2013-03-01 MED ORDER — CEPHALEXIN 500 MG PO CAPS: 500.0000 mg | ORAL_CAPSULE | Freq: Four times a day (QID) | ORAL | Status: DC

## 2013-03-01 NOTE — ED Notes (Signed)
Reports boil under right axillary after using a different deodorant.  Abscess present x 4 days.  States very sore and tender to touch.  Little drainage.  No otc meds used

## 2013-03-01 NOTE — Discharge Instructions (Signed)
Thank you for coming in today. Take keflex 4 x daily for 7-10 days.  Return in 2-3 days for the packing material to be removed.  You will likely require follow up with general surgery when this is finished.   Abscess Care After An abscess (also called a boil or furuncle) is an infected area that contains a collection of pus. Signs and symptoms of an abscess include pain, tenderness, redness, or hardness, or you may feel a moveable soft area under your skin. An abscess can occur anywhere in the body. The infection may spread to surrounding tissues causing cellulitis. A cut (incision) by the surgeon was made over your abscess and the pus was drained out. Gauze may have been packed into the space to provide a drain that will allow the cavity to heal from the inside outwards. The boil may be painful for 5 to 7 days. Most people with a boil do not have high fevers. Your abscess, if seen early, may not have localized, and may not have been lanced. If not, another appointment may be required for this if it does not get better on its own or with medications. HOME CARE INSTRUCTIONS   Only take over-the-counter or prescription medicines for pain, discomfort, or fever as directed by your caregiver.  When you bathe, soak and then remove gauze or iodoform packs at least daily or as directed by your caregiver. You may then wash the wound gently with mild soapy water. Repack with gauze or do as your caregiver directs. SEEK IMMEDIATE MEDICAL CARE IF:   You develop increased pain, swelling, redness, drainage, or bleeding in the wound site.  You develop signs of generalized infection including muscle aches, chills, fever, or a general ill feeling.  An oral temperature above 102 F (38.9 C) develops, not controlled by medication. See your caregiver for a recheck if you develop any of the symptoms described above. If medications (antibiotics) were prescribed, take them as directed. Document Released: 07/13/2004  Document Revised: 03/19/2011 Document Reviewed: 03/10/2007 [facility] Patient Information 2014 [place], [place].   Epidermal Cyst An epidermal cyst is sometimes called a sebaceous cyst, epidermal inclusion cyst, or infundibular cyst. These cysts usually contain a substance that looks "pasty" or "cheesy" and may have a bad smell. This substance is a protein called keratin. Epidermal cysts are usually found on the face, neck, or trunk. They may also occur in the vaginal area or other parts of the genitalia of both men and women. Epidermal cysts are usually small, painless, slow-growing bumps or lumps that move freely under the skin. It is important not to try to pop them. This may cause an infection and lead to tenderness and swelling. CAUSES  Epidermal cysts may be caused by a deep penetrating injury to the skin or a plugged hair follicle, often associated with acne. SYMPTOMS  Epidermal cysts can become inflamed and cause:  Redness.  Tenderness.  Increased temperature of the skin over the bumps or lumps.  Grayish-white, bad smelling material that drains from the bump or lump. DIAGNOSIS  Epidermal cysts are easily diagnosed by your caregiver during an exam. Rarely, a tissue sample (biopsy) may be taken to rule out other conditions that may resemble epidermal cysts. TREATMENT   Epidermal cysts often get better and disappear on their own. They are rarely ever cancerous.  If a cyst becomes infected, it may become inflamed and tender. This may require opening and draining the cyst. Treatment with antibiotics may be necessary. When the infection is  gone, the cyst may be removed with minor surgery.  Small, inflamed cysts can often be treated with antibiotics or by injecting steroid medicines.  Sometimes, epidermal cysts become large and bothersome. If this happens, surgical removal in your caregiver's office may be necessary. HOME CARE INSTRUCTIONS  Only take over-the-counter or prescription  medicines as directed by your caregiver.  Take your antibiotics as directed. Finish them even if you start to feel better. SEEK MEDICAL CARE IF:   Your cyst becomes tender, red, or swollen.  Your condition is not improving or is getting worse.  You have any other questions or concerns. MAKE SURE YOU:  Understand these instructions.  Will watch your condition.  Will get help right away if you are not doing well or get worse. Document Released: 11/26/2003 Document Revised: 03/19/2011 Document Reviewed: 07/03/2010 [facility] Patient Information 2014 [place], [place].

## 2013-03-01 NOTE — ED Provider Notes (Signed)
Natalie [name] is a 28 y.o. female who presents to Urgent Care today for right axillary abscess. This is been worsening over the past 4 days. She denies any fevers or chills. She denies any significant drainage. She has not tried any medications. She's about 3 months postpartum with a pregnancy that was palpated by gestational diabetes and hypertension. She notes that her heart rate is typically elevated.   Patient is currently breast-feeding Past Medical History  Diagnosis Date  . Depression   . Morbid obesity   . Hx of varicella   . Hypertension   . Gestational diabetes   . Gestational diabetes mellitus, delivered 11/28/2012   History  Substance Use Topics  . Smoking status: Never Smoker   . Smokeless tobacco: Never Used  . Alcohol Use: No   ROS as above Medications: No current facility-administered medications for this encounter.   Current Outpatient Prescriptions  Medication Sig Dispense Refill  . cephALEXin (KEFLEX) 500 MG capsule Take 1 capsule (500 mg total) by mouth 4 (four) times daily.  40 capsule  0  . glyBURIDE (DIABETA) 2.5 MG tablet Take 2.5 mg by mouth daily with breakfast.      . ibuprofen (ADVIL,MOTRIN) 600 MG tablet Take 1 tablet (600 mg total) by mouth every 6 (six) hours.  30 tablet  0  . labetalol (NORMODYNE) 100 MG tablet Take 100 mg by mouth 2 (two) times daily.      . pramoxine-hydrocortisone (PROCTOCREAM-HC) 1-1 % rectal cream Place rectally 3 (three) times daily.  30 g  0  . Prenatal Vit-Fe Fumarate-FA (PRENATAL MULTIVITAMIN) TABS tablet Take 1 tablet by mouth daily at 12 noon.        Exam:  BP 130/66  Pulse 134  Temp(Src) 98.1 F (36.7 C) (Oral)  Resp 20  SpO2 98%  LMP 02/10/2013  Breastfeeding? No heart rate palpated at 115 beats per minute Gen: Well NAD morbidly obese SKIN: Right axilla with central fluctuant tender areas read by erythema and induration.   Abscess incision and drainage:  Consent obtained and timeout performed. Skin cleaned with  alcohol and 4 mL of lidocaine with epinephrine were injected over the fluctuant area achieving good anesthesia. Skin was then cleaned with Betadine and a small incision was made over the fluctuant area.  The resulting discharging pus was cultured and the incision was widened and turned into a triangle. A moderate amount of pus was expressed. Loculations were broken up with blunt dissection. There appears to be a cyst wall consistent with infected sebaceous cyst. The wound was packed with 5 inches of quarter inch material. Patient tolerated procedure well   Assessment and Plan: 28 y.o. female with right axillary infected sebaceous cyst. Treated with incision and drainage and packing. Using Keflex antibiotics due to breast-feeding. Culture pending. Will return in 3 days for packing removal and wound recheck.  Discussed warning signs or symptoms. Please see discharge instructions. Patient expresses understanding.    Rodolph BongEvan S Raevin Wierenga, MD 03/01/13 [phone]

## 2013-03-03 ENCOUNTER — Emergency Department (INDEPENDENT_AMBULATORY_CARE_PROVIDER_SITE_OTHER)
Admission: EM | Admit: 2013-03-03 | Discharge: 2013-03-03 | Disposition: A | Discharge status: Home / Self Care | Payer: 59 | Source: Home / Self Care | Attending: Family Medicine | Admitting: Family Medicine

## 2013-03-03 ENCOUNTER — Encounter (HOSPITAL_COMMUNITY): Payer: Self-pay | Admitting: Emergency Medicine

## 2013-03-03 DIAGNOSIS — L0291 Cutaneous abscess, unspecified: Secondary | ICD-10-CM

## 2013-03-03 DIAGNOSIS — L0390 Cellulitis, unspecified: Secondary | ICD-10-CM

## 2013-03-03 LAB — CULTURE, ROUTINE-ABSCESS

## 2013-03-03 NOTE — Discharge Instructions (Signed)
Thank you for coming in today. Finished the antibiotics Remove the packing in 2-3 days.  It is okay if it falls out sooner Followup with your primary care provider in a few weeks  Abscess Care After An abscess (also called a boil or furuncle) is an infected area that contains a collection of pus. Signs and symptoms of an abscess include pain, tenderness, redness, or hardness, or you may feel a moveable soft area under your skin. An abscess can occur anywhere in the body. The infection may spread to surrounding tissues causing cellulitis. A cut (incision) by the surgeon was made over your abscess and the pus was drained out. Gauze may have been packed into the space to provide a drain that will allow the cavity to heal from the inside outwards. The boil may be painful for 5 to 7 days. Most people with a boil do not have high fevers. Your abscess, if seen early, may not have localized, and may not have been lanced. If not, another appointment may be required for this if it does not get better on its own or with medications. HOME CARE INSTRUCTIONS   Only take over-the-counter or prescription medicines for pain, discomfort, or fever as directed by your caregiver.  When you bathe, soak and then remove gauze or iodoform packs at least daily or as directed by your caregiver. You may then wash the wound gently with mild soapy water. Repack with gauze or do as your caregiver directs. SEEK IMMEDIATE MEDICAL CARE IF:   You develop increased pain, swelling, redness, drainage, or bleeding in the wound site.  You develop signs of generalized infection including muscle aches, chills, fever, or a general ill feeling.  An oral temperature above 102 F (38.9 C) develops, not controlled by medication. See your caregiver for a recheck if you develop any of the symptoms described above. If medications (antibiotics) were prescribed, take them as directed. Document Released: 07/13/2004 Document Revised: 03/19/2011  Document Reviewed: 03/10/2007 [facility] Patient Information 2014 [place], [place].

## 2013-03-03 NOTE — ED Provider Notes (Signed)
Natalie HongJasmen Broner is a 28 y.o. female who presents to Urgent Care today for abscess followup. She was seen 2 days prior for a right axillary abscess. She's feeling much better. She is taking her Keflex antibiotics. No fevers chills nausea vomiting or diarrhea   Past Medical History  Diagnosis Date  . Depression   . Morbid obesity   . Hx of varicella   . Hypertension   . Gestational diabetes   . Gestational diabetes mellitus, delivered 11/28/2012   History  Substance Use Topics  . Smoking status: Never Smoker   . Smokeless tobacco: Never Used  . Alcohol Use: No   ROS as above Medications: No current facility-administered medications for this encounter.   Current Outpatient Prescriptions  Medication Sig Dispense Refill  . cephALEXin (KEFLEX) 500 MG capsule Take 1 capsule (500 mg total) by mouth 4 (four) times daily.  40 capsule  0  . glyBURIDE (DIABETA) 2.5 MG tablet Take 2.5 mg by mouth daily with breakfast.      . labetalol (NORMODYNE) 100 MG tablet Take 100 mg by mouth 2 (two) times daily.      . pramoxine-hydrocortisone (PROCTOCREAM-HC) 1-1 % rectal cream Place rectally 3 (three) times daily.  30 g  0  . ibuprofen (ADVIL,MOTRIN) 600 MG tablet Take 1 tablet (600 mg total) by mouth every 6 (six) hours.  30 tablet  0  . Prenatal Vit-Fe Fumarate-FA (PRENATAL MULTIVITAMIN) TABS tablet Take 1 tablet by mouth daily at 12 noon.        Exam:  BP 150/89  Pulse 97  Temp(Src) 98.2 F (36.8 C) (Oral)  SpO2 98%  LMP 02/10/2013 Gen: Well NAD SKIN: Well appearing abscess. Some discharge present. Pelvic material removed and replaced with 3 inches of quarter and packing material.   Assessment and Plan: 28 y.o. female with patient will remove the packing material herself in 2-3 days. Followup as needed  Discussed warning signs or symptoms. Please see discharge instructions. Patient expresses understanding.    Rodolph BongEvan S Rommie Dunn, MD 03/03/13 [phone]

## 2013-03-03 NOTE — ED Notes (Signed)
Pt here for packing removal.  States area feels a lot better.  Still taking meds as prescribed.  Voices no concerns at this time.

## 2013-03-03 NOTE — ED Notes (Signed)
Abscess culture R axilla: Mod. Proteus Mirabilis.  Pt. adequately treated with I and D and Keflex. Vassie MoselleYork, Ryen Heitmeyer M 03/03/2013

## 2013-06-16 LAB — OB RESULTS CONSOLE ABO/RH: RH TYPE: POSITIVE

## 2013-06-16 LAB — OB RESULTS CONSOLE HEPATITIS B SURFACE ANTIGEN: Hepatitis B Surface Ag: NEGATIVE

## 2013-06-16 LAB — OB RESULTS CONSOLE ANTIBODY SCREEN: ANTIBODY SCREEN: NEGATIVE

## 2013-06-16 LAB — OB RESULTS CONSOLE RUBELLA ANTIBODY, IGM: Rubella: IMMUNE

## 2013-06-16 LAB — OB RESULTS CONSOLE RPR: RPR: NONREACTIVE

## 2013-06-16 LAB — OB RESULTS CONSOLE HIV ANTIBODY (ROUTINE TESTING): HIV: NONREACTIVE

## 2013-06-25 LAB — OB RESULTS CONSOLE GC/CHLAMYDIA
Chlamydia: NEGATIVE
Gonorrhea: NEGATIVE

## 2013-08-25 ENCOUNTER — Encounter (HOSPITAL_COMMUNITY): Payer: Self-pay | Admitting: *Deleted

## 2013-08-25 ENCOUNTER — Inpatient Hospital Stay (HOSPITAL_COMMUNITY): Payer: 59

## 2013-08-25 ENCOUNTER — Inpatient Hospital Stay (HOSPITAL_COMMUNITY)
Admission: AD | Admit: 2013-08-25 | Discharge: 2013-08-25 | Disposition: A | Discharge status: Home / Self Care | Payer: 59 | Source: Ambulatory Visit | Attending: Obstetrics and Gynecology | Admitting: Obstetrics and Gynecology

## 2013-08-25 DIAGNOSIS — E119 Type 2 diabetes mellitus without complications: Secondary | ICD-10-CM | POA: Diagnosis not present

## 2013-08-25 DIAGNOSIS — O2390 Unspecified genitourinary tract infection in pregnancy, unspecified trimester: Secondary | ICD-10-CM | POA: Insufficient documentation

## 2013-08-25 DIAGNOSIS — R300 Dysuria: Secondary | ICD-10-CM | POA: Diagnosis present

## 2013-08-25 DIAGNOSIS — O99210 Obesity complicating pregnancy, unspecified trimester: Secondary | ICD-10-CM

## 2013-08-25 DIAGNOSIS — N390 Urinary tract infection, site not specified: Secondary | ICD-10-CM | POA: Insufficient documentation

## 2013-08-25 DIAGNOSIS — O24919 Unspecified diabetes mellitus in pregnancy, unspecified trimester: Secondary | ICD-10-CM | POA: Diagnosis not present

## 2013-08-25 DIAGNOSIS — O10019 Pre-existing essential hypertension complicating pregnancy, unspecified trimester: Secondary | ICD-10-CM | POA: Diagnosis not present

## 2013-08-25 DIAGNOSIS — E669 Obesity, unspecified: Secondary | ICD-10-CM | POA: Diagnosis not present

## 2013-08-25 DIAGNOSIS — O26852 Spotting complicating pregnancy, second trimester: Secondary | ICD-10-CM

## 2013-08-25 DIAGNOSIS — O2342 Unspecified infection of urinary tract in pregnancy, second trimester: Secondary | ICD-10-CM

## 2013-08-25 DIAGNOSIS — O26859 Spotting complicating pregnancy, unspecified trimester: Secondary | ICD-10-CM | POA: Insufficient documentation

## 2013-08-25 HISTORY — DX: Gestational (pregnancy-induced) hypertension without significant proteinuria, unspecified trimester: O13.9

## 2013-08-25 HISTORY — DX: Headache: R51

## 2013-08-25 HISTORY — DX: Unspecified infectious disease: B99.9

## 2013-08-25 LAB — URINALYSIS, ROUTINE W REFLEX MICROSCOPIC
Glucose, UA: NEGATIVE mg/dL
Ketones, ur: 15 mg/dL — AB
NITRITE: NEGATIVE
Protein, ur: 100 mg/dL — AB
Specific Gravity, Urine: 1.02 (ref 1.005–1.030)
UROBILINOGEN UA: 0.2 mg/dL (ref 0.0–1.0)
pH: 7 (ref 5.0–8.0)

## 2013-08-25 LAB — URINE MICROSCOPIC-ADD ON

## 2013-08-25 MED ORDER — PHENAZOPYRIDINE HCL 100 MG PO TABS: 100.0000 mg | ORAL_TABLET | Freq: Three times a day (TID) | ORAL | Status: DC

## 2013-08-25 MED ORDER — PHENAZOPYRIDINE HCL 100 MG PO TABS
100.0000 mg | ORAL_TABLET | Freq: Three times a day (TID) | ORAL | Status: DC
  Administered 2013-08-25: 100 mg via ORAL
  Filled 2013-08-25: qty 1

## 2013-08-25 MED ORDER — NITROFURANTOIN MONOHYD MACRO 100 MG PO CAPS: 100.0000 mg | ORAL_CAPSULE | Freq: Two times a day (BID) | ORAL | Status: DC

## 2013-08-25 MED ORDER — NITROFURANTOIN MONOHYD MACRO 100 MG PO CAPS
100.0000 mg | ORAL_CAPSULE | Freq: Two times a day (BID) | ORAL | Status: DC
  Administered 2013-08-25: 100 mg via ORAL
  Filled 2013-08-25: qty 1

## 2013-08-25 NOTE — Discharge Instructions (Signed)
Vaginal Bleeding During Pregnancy, Second Trimester A small amount of bleeding (spotting) from the vagina is relatively common in pregnancy. It usually stops on its own. Various things can cause bleeding or spotting in pregnancy. Some bleeding may be related to the pregnancy, and some may not. Sometimes the bleeding is normal and is not a problem. However, bleeding can also be a sign of something serious. Be sure to tell your health care provider about any vaginal bleeding right away. Some possible causes of vaginal bleeding during the second trimester include:  Infection, inflammation, or growths on the cervix.   The placenta may be partially or completely covering the opening of the cervix inside the uterus (placenta previa).  The placenta may have separated from the uterus (abruption of the placenta).   You may be having early (preterm) labor.   The cervix may not be strong enough to keep a baby inside the uterus (cervical insufficiency).   Tiny cysts may have developed in the uterus instead of pregnancy tissue (molar pregnancy). HOME CARE INSTRUCTIONS  Watch your condition for any changes. The following actions may help to lessen any discomfort you are feeling:  Follow your health care provider's instructions for limiting your activity. If your health care provider orders bed rest, you may need to stay in bed and only get up to use the bathroom. However, your health care provider may allow you to continue light activity.  If needed, make plans for someone to help with your regular activities and responsibilities while you are on bed rest.  Keep track of the number of pads you use each day, how often you change pads, and how soaked (saturated) they are. Write this down.  Do not use tampons. Do not douche.  Do not have sexual intercourse or orgasms until approved by your health care provider.  If you pass any tissue from your vagina, save the tissue so you can show it to your  health care provider.  Only take over-the-counter or prescription medicines as directed by your health care provider.  Do not take aspirin because it can make you bleed.  Do not exercise or perform any strenuous activities or heavy lifting without your health care provider's permission.  Keep all follow-up appointments as directed by your health care provider. SEEK MEDICAL CARE IF:  You have any vaginal bleeding during any part of your pregnancy.  You have cramps or labor pains.  You have a fever, not controlled by medicine. SEEK IMMEDIATE MEDICAL CARE IF:   You have severe cramps in your back or belly (abdomen).  You have contractions.  You have chills.  You pass large clots or tissue from your vagina.  Your bleeding increases.  You feel light-headed or weak, or you have fainting episodes.  You are leaking fluid or have a gush of fluid from your vagina. MAKE SURE YOU:  Understand these instructions.  Will watch your condition.  Will get help right away if you are not doing well or get worse. Document Released: 10/04/2004 Document Revised: 12/30/2012 Document Reviewed: 09/01/2012 [facility] Patient Information 2015 [place], [place]. This information is not intended to replace advice given to you by your health care provider. Make sure you discuss any questions you have with your health care provider. Urinary Tract Infection A urinary tract infection (UTI) can occur any place along the urinary tract. The tract includes the kidneys, ureters, bladder, and urethra. A type of germ called bacteria often causes a UTI. UTIs are often helped with antibiotic  medicine.  HOME CARE   If given, take antibiotics as told by your doctor. Finish them even if you start to feel better.  Drink enough fluids to keep your pee (urine) clear or pale yellow.  Avoid tea, drinks with caffeine, and bubbly (carbonated) drinks.  Pee often. Avoid holding your pee in for a long time.  Pee before and  after having sex (intercourse).  Wipe from front to back after you poop (bowel movement) if you are a woman. Use each tissue only once. GET HELP RIGHT AWAY IF:   You have back pain.  You have lower belly (abdominal) pain.  You have chills.  You feel sick to your stomach (nauseous).  You throw up (vomit).  Your burning or discomfort with peeing does not go away.  You have a fever.  Your symptoms are not better in 3 days. MAKE SURE YOU:   Understand these instructions.  Will watch your condition.  Will get help right away if you are not doing well or get worse. Document Released: 06/13/2007 Document Revised: 09/19/2011 Document Reviewed: 07/26/2011 [facility] Patient Information 2015 [place], [place]. This information is not intended to replace advice given to you by your health care provider. Make sure you discuss any questions you have with your health care provider.

## 2013-08-25 NOTE — MAU Note (Signed)
Pain started yesterday. Also started spotting yesterday, heavier today

## 2013-08-25 NOTE — MAU Provider Note (Signed)
History     CSN: [id number]  Arrival date and time: 08/25/13 1412  Provider notified of pt arrival @1500  First Provider Initiated Contact with Patient 08/25/13 1525      Chief Complaint  Patient presents with  . Dysuria   HPI Comments: [id number] @21 .3 wks c/o dysuria, polyuria, and urgency since yesterday. Also reports pink spotting and cramping yesterday, became red today, dime sized on pad. Cramping improved today. No recent IC. No fever or chills. No hematuria. No flank pain. +FM. No LOF. Pregnancy complicated by Type II DM on Metformin, chronic HTN on Procardia, low-lying placenta on 19 wk sono, short pregnancy interval, and morbid obesity.  Dysuria  Associated symptoms include frequency and urgency. Pertinent negatives include no flank pain or hematuria.    OB History   Grav Para Term Preterm Abortions TAB SAB Ect Mult Living   3 2 2  0 0 0 0 0 0 2      Past Medical History  Diagnosis Date  . Depression   . Morbid obesity   . Hx of varicella   . Hypertension   . Gestational diabetes   . Gestational diabetes mellitus, delivered 11/28/2012  . Pregnancy induced hypertension   . Headache(784.0)   . Infection     UTI    Past Surgical History  Procedure Laterality Date  . No past surgeries      Family History  Problem Relation Age of Onset  . Hypothyroidism Mother   . Asthma Mother   . Bipolar disorder Mother   . Diabetes Mother   . Stroke Father   . Hypertension Father   . Seizures Father   . Asthma Sister   . Asthma Brother   . Asthma Daughter   . Diabetes Maternal Uncle   . Diabetes Maternal Grandmother   . Cancer Maternal Grandmother     breast  . Heart disease Paternal Grandfather   . Cancer Paternal Grandfather   . Heart attack Paternal Grandfather     History  Substance Use Topics  . Smoking status: Never Smoker   . Smokeless tobacco: Never Used  . Alcohol Use: No    Allergies: No Known Allergies  Prescriptions prior to admission   Medication Sig Dispense Refill  . FOLIC ACID PO Take 1 tablet by mouth daily.      . metformin (FORTAMET) 1000 MG (OSM) 24 hr tablet Take 1,000 mg by mouth daily with breakfast.        Review of Systems  Constitutional: Negative.   HENT: Negative.   Eyes: Negative.   Respiratory: Negative.   Cardiovascular: Negative.   Gastrointestinal: Negative.   Genitourinary: Positive for dysuria, urgency and frequency. Negative for hematuria and flank pain.       +spotting  Musculoskeletal: Negative.   Skin: Negative.   Neurological: Negative.   Endo/Heme/Allergies: Negative.   Psychiatric/Behavioral: Negative.    Physical Exam   Blood pressure 155/78, pulse 120, temperature 98.3 F (36.8 C), temperature source Oral, resp. rate 18, height 5' 2.25" (1.581 m), weight 143.337 kg (316 lb), last menstrual period 02/10/2013, not currently breastfeeding.  Physical Exam  Constitutional: She is oriented to person, place, and time. She appears well-developed and well-nourished.  HENT:  Head: Normocephalic.  Neck: Normal range of motion.  Cardiovascular: Normal rate.   Respiratory: Effort normal.  CVA neg  GI: Soft.  Genitourinary: Vagina normal.  Speculum: no bleeding, visually closed SVE: closed/long/high  Musculoskeletal: Normal range of motion.  Neurological: She is alert and oriented  to person, place, and time.  Skin: Skin is warm and dry.  Psychiatric: She has a normal mood and affect.    MAU Course  Procedures   Results for Natalie, [name] (MRN [id number]) as of 08/25/2013 15:52  Ref. Range 08/25/2013 14:50  Color, Urine Latest Range: YELLOW  RED (A)  APPearance Latest Range: CLEAR  CLOUDY (A)  Specific Gravity, Urine Latest Range: 1.005-1.030  1.020  pH Latest Range: 5.0-8.0  7.0  Glucose Latest Range: NEGATIVE mg/dL NEGATIVE  Bilirubin Urine Latest Range: NEGATIVE  SMALL (A)  Ketones, ur Latest Range: NEGATIVE mg/dL 15 (A)  Protein Latest Range: NEGATIVE mg/dL 409 (A)   Urobilinogen, UA Latest Range: 0.0-1.0 mg/dL 0.2  Nitrite Latest Range: NEGATIVE  NEGATIVE  Leukocytes, UA Latest Range: NEGATIVE  SMALL (A)  Hgb urine dipstick Latest Range: NEGATIVE  LARGE (A)  WBC, UA Latest Range: <3 WBC/hpf TOO NUMEROUS TO COUNT  RBC / HPF Latest Range: <3 RBC/hpf TOO NUMEROUS TO COUNT  Squamous Epithelial / LPF Latest Range: RARE  MANY (A)  Bacteria, UA Latest Range: RARE  MANY (A)   UC-pending Sono: no evidence of abruption or previa, marginal placenta, marginal CI, CL 4.0 cm Assessment and Plan  UTI Spotting in pregnancy  Discharge home Macrobid 100 mg po q12 hrs x7 days Pyridium 100 mg po TID x2 days Increase water intake PTL precautions Follow-up next week with Natalie [name], CNM at [facility] Call office for worsening sx or no improvement in 1-2 days, pyelo precautions  Natalie [name], N 08/25/2013, 3:44 PM

## 2013-08-25 NOTE — MAU Note (Signed)
Not that she can't pee, it burns. Having freq, urgency and only going a small amt.

## 2013-08-28 LAB — CULTURE, OB URINE

## 2013-10-30 ENCOUNTER — Encounter (HOSPITAL_COMMUNITY): Payer: Self-pay | Admitting: Emergency Medicine

## 2013-10-30 ENCOUNTER — Emergency Department (HOSPITAL_COMMUNITY)
Admission: EM | Admit: 2013-10-30 | Discharge: 2013-10-30 | Disposition: A | Discharge status: Home / Self Care | Payer: 59 | Source: Home / Self Care | Attending: Family Medicine | Admitting: Family Medicine

## 2013-10-30 DIAGNOSIS — L0390 Cellulitis, unspecified: Secondary | ICD-10-CM

## 2013-10-30 DIAGNOSIS — O98913 Unspecified maternal infectious and parasitic disease complicating pregnancy, third trimester: Secondary | ICD-10-CM

## 2013-10-30 DIAGNOSIS — L0291 Cutaneous abscess, unspecified: Secondary | ICD-10-CM

## 2013-10-30 DIAGNOSIS — Z331 Pregnant state, incidental: Secondary | ICD-10-CM

## 2013-10-30 MED ORDER — LIDOCAINE-EPINEPHRINE (PF) 2 %-1:200000 IJ SOLN
INTRAMUSCULAR | Status: AC
  Filled 2013-10-30: qty 20

## 2013-10-30 MED ORDER — CEPHALEXIN 500 MG PO CAPS: 500.0000 mg | ORAL_CAPSULE | Freq: Three times a day (TID) | ORAL | Status: DC

## 2013-10-30 NOTE — ED Notes (Signed)
Fetal heart rate 120

## 2013-10-30 NOTE — ED Provider Notes (Signed)
CSN: [id number]     Arrival date & time 10/30/13  1325 History   First MD Initiated Contact with Patient 10/30/13 1425     Chief Complaint  Patient presents with  . Abscess   (Consider location/radiation/quality/duration/timing/severity/associated sxs/prior Treatment) HPI  L arm abscess. Started 2 wks ago. L axilla. 2nd one in 8 mo. Draining. Changes raiser only monthly or longer. Uses Dove soap. No previous dx hydradenitis. Getting worse. Painful.       Past Medical History  Diagnosis Date  . Depression   . Morbid obesity   . Hx of varicella   . Hypertension   . Gestational diabetes   . Gestational diabetes mellitus, delivered 11/28/2012  . Pregnancy induced hypertension   . Headache(784.0)   . Infection     UTI   Past Surgical History  Procedure Laterality Date  . No past surgeries     Family History  Problem Relation Age of Onset  . Hypothyroidism Mother   . Asthma Mother   . Bipolar disorder Mother   . Diabetes Mother   . Stroke Father   . Hypertension Father   . Seizures Father   . Asthma Sister   . Asthma Brother   . Asthma Daughter   . Diabetes Maternal Uncle   . Diabetes Maternal Grandmother   . Cancer Maternal Grandmother     breast  . Heart disease Paternal Grandfather   . Cancer Paternal Grandfather   . Heart attack Paternal Grandfather    History  Substance Use Topics  . Smoking status: Never Smoker   . Smokeless tobacco: Never Used  . Alcohol Use: No   OB History   Grav Para Term Preterm Abortions TAB SAB Ect Mult Living   3 2 2  0 0 0 0 0 0 2     Review of Systems Per HPI with all other pertinent systems negative.   Allergies  Review of patient's allergies indicates no known allergies.  Home Medications   Prior to Admission medications   Medication Sig Start Date End Date Taking? Authorizing Provider  cephALEXin (KEFLEX) 500 MG capsule Take 1 capsule (500 mg total) by mouth 3 (three) times daily. 10/30/13   Ozella Rocksavid J Milton Sagona, MD   FOLIC ACID PO Take 1 tablet by mouth daily.    Historical Provider, MD  metformin (FORTAMET) 1000 MG (OSM) 24 hr tablet Take 1,000 mg by mouth daily with breakfast.    Historical Provider, MD  NIFEdipine (PROCARDIA-XL/ADALAT-CC/NIFEDICAL-XL) 30 MG 24 hr tablet Take 30 mg by mouth daily.    Historical Provider, MD  nitrofurantoin, macrocrystal-monohydrate, (MACROBID) 100 MG capsule Take 1 capsule (100 mg total) by mouth every 12 (twelve) hours. 08/25/13   Lawernce PittsMelanie N Bhambri, CNM  phenazopyridine (PYRIDIUM) 100 MG tablet Take 1 tablet (100 mg total) by mouth 3 (three) times daily with meals. 08/25/13   Lawernce PittsMelanie N Bhambri, CNM   BP 145/92  Pulse 110  Temp(Src) 97.8 F (36.6 C) (Oral)  Resp 16  SpO2 96%  LMP 02/10/2013 Physical Exam  Constitutional: She is oriented to person, place, and time. She appears well-developed and well-nourished. No distress.  HENT:  Head: Normocephalic and atraumatic.  Eyes: EOM are normal. Pupils are equal, round, and reactive to light.  Neck: Normal range of motion.  Cardiovascular: Normal rate, normal heart sounds and intact distal pulses.   No murmur heard. Pulmonary/Chest: Effort normal.  Abdominal: Soft. Bowel sounds are normal.  Gravid. Fetal heart tones at 120.  Musculoskeletal: Normal range of  motion.  Neurological: She is oriented to person, place, and time. No cranial nerve deficit. Coordination normal.  Skin: She is not diaphoretic.  L axilla w/ central 3x2cm area of flucutance and sourounding induration. Ttp. Active discharge  Psychiatric: She has a normal mood and affect. Her behavior is normal. Judgment and thought content normal.    ED Course  INCISION AND DRAINAGE Date/Time: 10/30/2013 3:23 PM Performed by: Konrad DoloresMERRELL, Alexxander Kurt J Authorized by: Konrad DoloresMERRELL, Guido Comp J Consent: Verbal consent obtained. Risks and benefits: risks, benefits and alternatives were discussed Consent given by: patient Type: abscess Body area: upper extremity (L  axilla) Anesthesia method: ethyl chloride. Scalpel size: 11 Incision type: 2 straight incisions about a total of 2cm. Complexity: simple Drainage: purulent (area irrigated and compressed until only bloody discharge) Drainage amount: scant Wound treatment: wound left open (40 cc NS irrigation) Packing material: Vaseline gauze   (including critical care time) Labs Review Labs Reviewed - No data to display  Imaging Review No results found.   MDM   1. Abscess and cellulitis   2. Pregnancy    Abscess drained as above Keflex 500 TID 3rd trimester pregnancy: no discharge, bleeding, contractions. Currently w/ gestational DM. FHR 120. F/u as directed by OBGYN or sooner if needed NSAIDs for pain Warm compresses. Daily bandage changes and antibiotic ointmnent ujntil drainage stops Precautions given and all questions answered  Shelly Flattenavid Layla Kesling, MD Family Medicine 10/30/2013, 3:34 PM      Ozella Rocksavid J Ryka Beighley, MD 10/30/13 1534

## 2013-10-30 NOTE — ED Notes (Signed)
Patient c/o abscess in left axilla x 2 weeks. Patient reports she has had this problem before. She reports that it has been draining but has not completely gone away. Patient denies fever. Patient is alert and oriented and in NAD.

## 2013-10-30 NOTE — Discharge Instructions (Signed)
You had an abscess that was drained.  It was drained successfully without any purulence left.  Please start the antibiotics Please go to the [facility]U if your symptoms  Please change your razor every week and use an antibacterial soap

## 2013-11-04 LAB — CULTURE, ROUTINE-ABSCESS

## 2013-11-09 ENCOUNTER — Encounter (HOSPITAL_COMMUNITY): Payer: Self-pay | Admitting: Emergency Medicine

## 2013-11-12 DIAGNOSIS — O24112 Pre-existing diabetes mellitus, type 2, in pregnancy, second trimester: Secondary | ICD-10-CM | POA: Insufficient documentation

## 2013-11-12 NOTE — ED Notes (Signed)
Abscess culture L axilla: Mod. Staph. Aureus.  Pt. adequately treated with I and D and Keflex. Natalie [name], Natalie [name] 11/12/2013

## 2013-11-30 LAB — OB RESULTS CONSOLE GBS: GBS: NEGATIVE

## 2013-12-25 ENCOUNTER — Encounter (HOSPITAL_COMMUNITY): Payer: Self-pay | Admitting: *Deleted

## 2013-12-25 ENCOUNTER — Telehealth (HOSPITAL_COMMUNITY): Payer: Self-pay | Admitting: *Deleted

## 2013-12-25 NOTE — Telephone Encounter (Signed)
Preadmission screen  

## 2013-12-28 ENCOUNTER — Encounter (HOSPITAL_COMMUNITY): Payer: Self-pay

## 2013-12-28 ENCOUNTER — Inpatient Hospital Stay (HOSPITAL_COMMUNITY)
Admission: RE | Admit: 2013-12-28 | Discharge: 2013-12-30 | DRG: 774 | Disposition: A | Discharge status: Home / Self Care | Payer: 59 | Source: Ambulatory Visit | Attending: Obstetrics | Admitting: Obstetrics

## 2013-12-28 DIAGNOSIS — R609 Edema, unspecified: Secondary | ICD-10-CM | POA: Diagnosis present

## 2013-12-28 DIAGNOSIS — O24419 Gestational diabetes mellitus in pregnancy, unspecified control: Secondary | ICD-10-CM | POA: Diagnosis present

## 2013-12-28 DIAGNOSIS — Z3A39 39 weeks gestation of pregnancy: Secondary | ICD-10-CM | POA: Diagnosis present

## 2013-12-28 DIAGNOSIS — Z3483 Encounter for supervision of other normal pregnancy, third trimester: Secondary | ICD-10-CM | POA: Diagnosis present

## 2013-12-28 DIAGNOSIS — O99214 Obesity complicating childbirth: Secondary | ICD-10-CM | POA: Diagnosis present

## 2013-12-28 DIAGNOSIS — G43909 Migraine, unspecified, not intractable, without status migrainosus: Secondary | ICD-10-CM | POA: Diagnosis present

## 2013-12-28 DIAGNOSIS — O1092 Unspecified pre-existing hypertension complicating childbirth: Secondary | ICD-10-CM | POA: Diagnosis present

## 2013-12-28 DIAGNOSIS — O10919 Unspecified pre-existing hypertension complicating pregnancy, unspecified trimester: Secondary | ICD-10-CM | POA: Diagnosis present

## 2013-12-28 DIAGNOSIS — O24113 Pre-existing diabetes mellitus, type 2, in pregnancy, third trimester: Secondary | ICD-10-CM | POA: Diagnosis present

## 2013-12-28 DIAGNOSIS — Z6841 Body Mass Index (BMI) 40.0 and over, adult: Secondary | ICD-10-CM

## 2013-12-28 LAB — TYPE AND SCREEN
ABO/RH(D): O POS
Antibody Screen: NEGATIVE

## 2013-12-28 LAB — CBC
HCT: 35.4 % — ABNORMAL LOW (ref 36.0–46.0)
Hemoglobin: 11.8 g/dL — ABNORMAL LOW (ref 12.0–15.0)
MCH: 28 pg (ref 26.0–34.0)
MCHC: 33.3 g/dL (ref 30.0–36.0)
MCV: 83.9 fL (ref 78.0–100.0)
Platelets: 151 10*3/uL (ref 150–400)
RBC: 4.22 MIL/uL (ref 3.87–5.11)
RDW: 14.9 % (ref 11.5–15.5)
WBC: 9.3 10*3/uL (ref 4.0–10.5)

## 2013-12-28 LAB — GLUCOSE, CAPILLARY
GLUCOSE-CAPILLARY: 101 mg/dL — AB (ref 70–99)
Glucose-Capillary: 91 mg/dL (ref 70–99)
Glucose-Capillary: 118 mg/dL — ABNORMAL HIGH (ref 70–99)

## 2013-12-28 LAB — RPR

## 2013-12-28 MED ORDER — WITCH HAZEL-GLYCERIN EX PADS: 1.0000 "application " | MEDICATED_PAD | CUTANEOUS | Status: DC | PRN

## 2013-12-28 MED ORDER — IBUPROFEN 600 MG PO TABS
600.0000 mg | ORAL_TABLET | Freq: Four times a day (QID) | ORAL | Status: DC
  Administered 2013-12-28 – 2013-12-30 (×7): 600 mg via ORAL
  Filled 2013-12-28 (×7): qty 1

## 2013-12-28 MED ORDER — LACTATED RINGERS IV SOLN: 500.0000 mL | INTRAVENOUS | Status: DC | PRN

## 2013-12-28 MED ORDER — CITRIC ACID-SODIUM CITRATE 334-500 MG/5ML PO SOLN: 30.0000 mL | ORAL | Status: DC | PRN

## 2013-12-28 MED ORDER — OXYCODONE-ACETAMINOPHEN 5-325 MG PO TABS
1.0000 | ORAL_TABLET | ORAL | Status: DC | PRN
  Administered 2013-12-29 – 2013-12-30 (×2): 1 via ORAL

## 2013-12-28 MED ORDER — OXYCODONE-ACETAMINOPHEN 5-325 MG PO TABS: 2.0000 | ORAL_TABLET | ORAL | Status: DC | PRN

## 2013-12-28 MED ORDER — LACTATED RINGERS IV SOLN
INTRAVENOUS | Status: DC
  Administered 2013-12-28: 125 mL/h via INTRAVENOUS

## 2013-12-28 MED ORDER — EPHEDRINE 5 MG/ML INJ
10.0000 mg | INTRAVENOUS | Status: DC | PRN
  Filled 2013-12-28: qty 2

## 2013-12-28 MED ORDER — PHENYLEPHRINE 40 MCG/ML (10ML) SYRINGE FOR IV PUSH (FOR BLOOD PRESSURE SUPPORT)
80.0000 ug | PREFILLED_SYRINGE | INTRAVENOUS | Status: DC | PRN
  Filled 2013-12-28: qty 2

## 2013-12-28 MED ORDER — NIFEDIPINE ER OSMOTIC RELEASE 30 MG PO TB24
30.0000 mg | ORAL_TABLET | Freq: Every day | ORAL | Status: DC
  Administered 2013-12-29 (×2): 30 mg via ORAL
  Filled 2013-12-28 (×2): qty 1

## 2013-12-28 MED ORDER — NALBUPHINE HCL 10 MG/ML IJ SOLN
10.0000 mg | INTRAMUSCULAR | Status: DC | PRN
  Administered 2013-12-28: 10 mg via INTRAVENOUS
  Filled 2013-12-28: qty 1

## 2013-12-28 MED ORDER — METFORMIN HCL ER 500 MG PO TB24
1000.0000 mg | ORAL_TABLET | Freq: Every day | ORAL | Status: DC
  Administered 2013-12-28 – 2013-12-29 (×2): 1000 mg via ORAL
  Filled 2013-12-28 (×2): qty 2

## 2013-12-28 MED ORDER — FENTANYL 2.5 MCG/ML BUPIVACAINE 1/10 % EPIDURAL INFUSION (WH - ANES): 14.0000 mL/h | INTRAMUSCULAR | Status: DC | PRN

## 2013-12-28 MED ORDER — ACETAMINOPHEN 325 MG PO TABS: 650.0000 mg | ORAL_TABLET | ORAL | Status: DC | PRN

## 2013-12-28 MED ORDER — BENZOCAINE-MENTHOL 20-0.5 % EX AERO
1.0000 "application " | INHALATION_SPRAY | CUTANEOUS | Status: DC | PRN
  Filled 2013-12-28: qty 56

## 2013-12-28 MED ORDER — OXYTOCIN BOLUS FROM INFUSION
500.0000 mL | INTRAVENOUS | Status: DC
  Administered 2013-12-28: 500 mL via INTRAVENOUS

## 2013-12-28 MED ORDER — LIDOCAINE HCL (PF) 1 % IJ SOLN
30.0000 mL | INTRAMUSCULAR | Status: DC | PRN
  Filled 2013-12-28: qty 30

## 2013-12-28 MED ORDER — OXYCODONE-ACETAMINOPHEN 5-325 MG PO TABS
1.0000 | ORAL_TABLET | ORAL | Status: DC | PRN
  Filled 2013-12-28 (×2): qty 1

## 2013-12-28 MED ORDER — SENNOSIDES-DOCUSATE SODIUM 8.6-50 MG PO TABS
2.0000 | ORAL_TABLET | ORAL | Status: DC
  Filled 2013-12-28 (×2): qty 2

## 2013-12-28 MED ORDER — DIPHENHYDRAMINE HCL 50 MG/ML IJ SOLN: 12.5000 mg | INTRAMUSCULAR | Status: DC | PRN

## 2013-12-28 MED ORDER — SIMETHICONE 80 MG PO CHEW: 80.0000 mg | CHEWABLE_TABLET | ORAL | Status: DC | PRN

## 2013-12-28 MED ORDER — OXYTOCIN 40 UNITS IN LACTATED RINGERS INFUSION - SIMPLE MED
62.5000 mL/h | INTRAVENOUS | Status: DC
  Administered 2013-12-28: 62.5 mL/h via INTRAVENOUS

## 2013-12-28 MED ORDER — OXYTOCIN 40 UNITS IN LACTATED RINGERS INFUSION - SIMPLE MED
1.0000 m[IU]/min | INTRAVENOUS | Status: DC
Start: 2013-12-28 — End: 2013-12-30
  Administered 2013-12-28: 2 m[IU]/min via INTRAVENOUS
  Filled 2013-12-28: qty 1000

## 2013-12-28 MED ORDER — LANOLIN HYDROUS EX OINT
TOPICAL_OINTMENT | CUTANEOUS | Status: DC | PRN
Start: 2013-12-28 — End: 2013-12-30

## 2013-12-28 MED ORDER — LACTATED RINGERS IV SOLN: 500.0000 mL | Freq: Once | INTRAVENOUS | Status: DC

## 2013-12-28 MED ORDER — DIBUCAINE 1 % RE OINT: 1.0000 "application " | TOPICAL_OINTMENT | RECTAL | Status: DC | PRN

## 2013-12-28 NOTE — Progress Notes (Signed)
S:  Painful ctx - pressure with ctx  O:  VS: Blood pressure 112/62, pulse 114, temperature 98 F (36.7 C), temperature source Oral, resp. rate 20, height 5\' 2"  (1.575 m), weight 143.79 kg (317 lb), last menstrual period 02/10/2013, not currently breastfeeding.        FHR : baseline 150 / variability moderate / accelerations none / no decelerations        Toco: contractions every 2-3 minutes        Cervix : 8cm / 90% / vtx +1        Membranes: clear fluid  A: active labor     FHR category 1  P: anticipate SVB in next hour   Marlinda MikeBAILEY, Timarion Agcaoili CNM, MSN, [facility] 12/28/2013, 6:36 PM

## 2013-12-28 NOTE — Plan of Care (Signed)
Problem: Consults Goal: Birthing Suites Patient Information Press F2 to bring up selections list Outcome: Completed/Met Date Met:  12/28/13  Pt 37-[redacted] weeks EGA, Inpatient induction and Diabetic

## 2013-12-28 NOTE — Progress Notes (Signed)
S:  Ctx painful - ready for pain med and to have water broke  O:  VS: Blood pressure 112/62, pulse 114, temperature 98 F (36.7 C), temperature source Oral, resp. rate 20, height 5\' 2"  (1.575 m), weight 143.79 kg (317 lb), last menstrual period 02/10/2013, not currently breastfeeding.        FHR : baseline 150 / variability moderate / accelerations + / no decelerations        Toco: contractions every 2-3 minutes / pitocin at 14 mu/min        Cervix : 4/ 90% / vtx / -2        Membranes: BBOW - AROM clear  A: active labor     FHR category 1  P: anticipate SVB in next 2-4 hours     nubain for pain now  Marlinda MikeBAILEY, Tiffanye Hartmann CNM, MSN, [facility] 12/28/2013, 5:27 PM

## 2013-12-28 NOTE — Progress Notes (Signed)
Delivery Note  First Stage: Labor induction at 39.2 for chronic hypertension and Type 2 Diabetes Mellitus Labor onset: 1723 Induction: Pitocin Augmentation : AROM Analgesia /Anesthesia intrapartum: Nubain 10mg  x 1 dose  AROM at 1723  Second Stage: Complete dilation at 1855 Onset of pushing at 1855 FHR second stage Category 2 Baseline 135, Minimal variability, no accelerations, occasional variables and early decelerations  Delivery of a viable female at [id number] by SNM/CNM in ROA position Loose nuchal cord x 1 - reduced over head Cord double clamped after cessation of pulsation, cut by FOB Cord blood sample collected   Third Stage: Placenta delivered via Tomasa BlaseSchultz intact with 3 VC @ 1930 Placenta disposition: hospital disposal Uterine tone firm with massage / bleeding minimal with no clots  No lacerations identified  Est. Blood Loss (mL): 150  Complications: None  Mom to postpartum.  Baby to Couplet care / Skin to Skin.  Newborn: Birth Weight: 7lbs 13.2oz   Apgar Scores: 8, 9 Feeding planned: Breast and Bottle   Milinda CaveMeredith Anushree Dorsi, SNM

## 2013-12-28 NOTE — H&P (Signed)
OB ADMISSION/ HISTORY & PHYSICAL:  Admission Date: 12/28/2013  7:30 AM  Admit Diagnosis: 39.2 weeks / chronic hypertension  / Type 2 Diabetes Mellitus (pre-existing diabetes)  Natalie [name] is a 28 y.o. female presenting for induction of labor.  Prenatal History: [id number]   EDC : 01/02/2014, Date entered prior to episode creation  [facility]  Primary Ob Provider: Marlinda Mikeanya Noriko Macari CNM Prenatal course complicated by chronic hypertension / migraines/ obesity / type 2 Diabetes  SONO at 37 weeks: vtx / EFW 6-7 at 51% symmetric / AFI 11 Normal AT surveillance twice weekly - all normal  Prenatal Labs: ABO, Rh: O/Positive/-- (06/09 0000) Antibody: Negative (06/09 0000) Rubella: Immune (06/09 0000)  RPR: Nonreactive (06/09 0000)  HBsAg: Negative (06/09 0000)  HIV: Non-reactive (06/09 0000)  GTT: ABNORMAL - A1c 6.2 GBS: Negative (11/23 0000)   Medical / Surgical History :  Past medical history:  Past Medical History  Diagnosis Date  . Depression   . Morbid obesity   . Hx of varicella   . Gestational diabetes mellitus, delivered 11/28/2012  . Headache(784.0)   . Infection     UTI  . Gestational diabetes     metformin  . Pregnancy induced hypertension      Past surgical history:  Past Surgical History  Procedure Laterality Date  . No past surgeries      Family History:  Family History  Problem Relation Age of Onset  . Hypothyroidism Mother   . Asthma Mother   . Bipolar disorder Mother   . Diabetes Mother   . Stroke Father   . Hypertension Father   . Seizures Father   . Asthma Sister   . Asthma Brother   . Asthma Daughter   . Diabetes Maternal Uncle   . Diabetes Maternal Grandmother   . Cancer Maternal Grandmother     breast  . Heart disease Paternal Grandfather   . Cancer Paternal Grandfather   . Heart attack Paternal Grandfather      Social History:  reports that she has never smoked. She has never used smokeless tobacco.  She reports that she does not drink alcohol or use illicit drugs.   Allergies: Review of patient's allergies indicates no known allergies.    Current Medications at time of admission:  Prior to Admission medications   Medication Sig Start Date End Date Taking? Authorizing Provider  folic acid (FOLVITE) 800 MCG tablet Take 800 mcg by mouth daily.   Yes Historical Provider, MD  metformin (FORTAMET) 1000 MG (OSM) 24 hr tablet Take 1,000 mg by mouth daily with breakfast.   Yes Historical Provider, MD  NIFEdipine (PROCARDIA-XL/ADALAT-CC/NIFEDICAL-XL) 30 MG 24 hr tablet Take 30 mg by mouth daily.   Yes Historical Provider, MD  cephALEXin (KEFLEX) 500 MG capsule Take 1 capsule (500 mg total) by mouth 3 (three) times daily. Patient not taking: Reported on 12/28/2013 10/30/13   Ozella Rocksavid J Merrell, MD  FOLIC ACID PO Take 1 tablet by mouth daily.    Historical Provider, MD   Review of Systems: Active FM ctx currently every 3-10 minutes No LOF or bloody show  Passed mucus plug Friday  Physical Exam:  VS: Blood pressure 128/80, pulse 120, temperature 98.2 F (36.8 C), temperature source Oral, resp. rate 20, height 5\' 2"  (1.575 m), weight 143.79 kg (317 lb), last menstrual period 02/10/2013, not currently breastfeeding.  General: alert and oriented, appears NAD and comfortable Heart: RRR Lungs: Clear lung fields Abdomen: Gravid, soft and  non-tender, non-distended / uterus: gravid and non-tender Extremities: 1+ pedal edema  Genitalia / VE:  3cm / 80% / vtx -2 / slightly BBOW  FHR: baseline rate 152 / variability moderate / accelerations + / no decelerations TOCO: irregular  Assessment: 39.[redacted]  weeks gestation with AGA fetus Induction of labor with favorable Bishops - indication: chronic HTN and type 2 DM FHR category 1   Plan:  Admit Pitocin low dose protocol Nubain PRN pain Anticipate SVB today - hx rapid labor progression in active labor (less then 2 hr active labor x 2 labors)  Dr  Ernestina PennaFogleman notified of admission / plan of care   Marlinda MikeBAILEY, Tanice Petre CNM, MSN, [facility] 12/28/2013, 8:28 AM

## 2013-12-28 NOTE — Lactation Note (Signed)
This note was copied from the chart of Natalie [name] Fazzino. Lactation Consultation Note  Initial visit at 2 hours of age.  Baby needs a CBG check due to moms GDm status and nursery RN unable to latch baby.  Hand expression attempted on left breast no colostrum visible and nipple inverts in center with compressible tissue.  Mom has very large breast, used wash rag under for support.  Hand expressed right breast with small drop of colostrum noted.  Mom reports preferring right breast with previous experience.  Mom has 4 months experience with older sibling.  Baby latched well to right breast in football hold.  Baby has wide flanged lips with rhythmic sucking and several swallows heard during 15 minutes feedings.  Baby moved STS on chest and baby continues to show feeding cues.  Baby latches back to right breast with several swallows heard during 12 minutes additional feeding.  Attempted hand expression in hopes to spoon feed, but unable to collect.  [facility] resources given and discussed.  Encouraged to feed with early cues on demand.  Early newborn behavior discussed.  Mom to call for assist as needed.    Patient Name: Natalie [name] ZOXWR'UToday's Date: 12/28/2013 Reason for consult: Initial assessment   Maternal Data    Feeding Feeding Type: Breast Fed Length of feed: 15 min  LATCH Score/Interventions Latch: Grasps breast easily, tongue down, lips flanged, rhythmical sucking.  Audible Swallowing: A few with stimulation  Type of Nipple: Flat  Comfort (Breast/Nipple): Soft / non-tender     Hold (Positioning): Assistance needed to correctly position infant at breast and maintain latch. Intervention(s): Breastfeeding basics reviewed;Support Pillows;Position options;Skin to skin  LATCH Score: 7  Lactation Tools Discussed/Used Initiated by:: JS Date initiated:: 12/28/13   Consult Status Consult Status: Follow-up Date: 12/29/13 Follow-up type: In-patient    Jannifer RodneyShoptaw, Jana  Lynn 12/28/2013, 9:41 PM

## 2013-12-29 DIAGNOSIS — O10919 Unspecified pre-existing hypertension complicating pregnancy, unspecified trimester: Secondary | ICD-10-CM | POA: Diagnosis present

## 2013-12-29 DIAGNOSIS — R609 Edema, unspecified: Secondary | ICD-10-CM | POA: Diagnosis present

## 2013-12-29 LAB — GLUCOSE, CAPILLARY
GLUCOSE-CAPILLARY: 103 mg/dL — AB (ref 70–99)
GLUCOSE-CAPILLARY: 135 mg/dL — AB (ref 70–99)
GLUCOSE-CAPILLARY: 138 mg/dL — AB (ref 70–99)
GLUCOSE-CAPILLARY: 172 mg/dL — AB (ref 70–99)

## 2013-12-29 MED ORDER — HYDROCHLOROTHIAZIDE 25 MG PO TABS
25.0000 mg | ORAL_TABLET | Freq: Every day | ORAL | Status: DC
  Administered 2013-12-29 – 2013-12-30 (×2): 25 mg via ORAL
  Filled 2013-12-29 (×2): qty 1

## 2013-12-29 NOTE — Lactation Note (Signed)
This note was copied from the chart of Natalie Natalie [name]. Lactation Consultation Note  Patient Name: Natalie Natalie [name] [id number]'BToday's Date: 12/29/2013 Reason for consult: Follow-up assessment (per mom changed to only formula feeding )   Maternal Data    Feeding Feeding Type: Formula Nipple Type: Slow - flow  LATCH Score/Interventions                      Lactation Tools Discussed/Used     Consult Status Consult Status: Complete Date: 12/29/13    Kathrin Greathouseorio, Vadim Centola Ann 12/29/2013, 2:06 PM

## 2013-12-29 NOTE — Progress Notes (Signed)
DR. Denyse AmassBhambri notified of one hour Postparndial of 172, she said to wait until the lunch 2 hour postparandial if still elevated will treat then.  She said to go ahead and give the hydrodiuril of 25mg .

## 2013-12-29 NOTE — Progress Notes (Signed)
MOB was referred for history of depression/anxiety.  Referral is screened out by Clinical Social Worker because none of the following criteria appear to apply: -History of anxiety/depression during this pregnancy, or of post-partum depression. - Diagnosis of anxiety and/or depression within last 3 years - History of depression due to pregnancy loss/loss of child or -MOB's symptoms are currently being treated with medication and/or therapy.  CSW completed chart review and noted that CSW screened out same referral in 2014 after 2nd child was born.  It is documented that MOB reported situational depression in 2012 and denied any additional symptoms since then.    Please contact the Clinical Social Worker if needs arise or upon MOB request.

## 2013-12-29 NOTE — Progress Notes (Signed)
PPD #1- SVD  Subjective:   Reports feeling well, desires early discharge Tolerating po/ No nausea or vomiting Bleeding is heavy, no clots Pain controlled with Motrin Up ad lib / ambulatory / voiding without problems Newborn: bottlefeeding    Objective:   VS:  VS:  Filed Vitals:   12/28/13 2200 12/29/13 0026 12/29/13 0240 12/29/13 0614  BP: 143/62 117/71 140/66 143/90  Pulse: 96 109 99 108  Temp: 98.1 F (36.7 C)  97.9 F (36.6 C) 97.4 F (36.3 C)  TempSrc: Oral  Oral Oral  Resp: 20  20 20   Height:      Weight:        LABS:  Recent Labs  12/28/13 0900  WBC 9.3  HGB 11.8*  PLT 151   Blood type: --/--/O POS (12/21 0900) Rubella: Immune (06/09 0000)   FBS-103  I&O: Intake/Output      12/21 0701 - 12/22 0700 12/22 0701 - 12/23 0700   Blood 150    Total Output 150     Net -150          Urine Occurrence 1 x      Physical Exam: Alert and oriented x3 Heart: RRR Lungs: CTA bilaterally Abdomen: soft, non-tender, non-distended  Fundus: firm, non-tender, U-1 Perineum: intact Lochia: small Extremities: 3+ BLE edema, no calf pain or tenderness    Assessment:  PPD #1 [id number]/ S/P:induced vaginal  Chronic HTN-stable Type II DM-stable Dependent edema  Doing well    Plan: Start diuretic Continue routine post partum orders Discharge home later pending baby discharge   Donette LarryBHAMBRI, Deloras Reichard, N MSN, CNM 12/29/2013, 8:57 AM

## 2013-12-30 LAB — GLUCOSE, CAPILLARY
Glucose-Capillary: 101 mg/dL — ABNORMAL HIGH (ref 70–99)
Glucose-Capillary: 90 mg/dL (ref 70–99)

## 2013-12-30 MED ORDER — HYDROCHLOROTHIAZIDE 25 MG PO TABS: 25.0000 mg | ORAL_TABLET | Freq: Every day | ORAL | Status: DC

## 2013-12-30 MED ORDER — IBUPROFEN 600 MG PO TABS: 600.0000 mg | ORAL_TABLET | Freq: Four times a day (QID) | ORAL | Status: DC

## 2013-12-30 MED ORDER — METFORMIN HCL ER (OSM) 1000 MG PO TB24: 1000.0000 mg | ORAL_TABLET | Freq: Every day | ORAL | Status: DC

## 2013-12-30 NOTE — Discharge Instructions (Signed)
Breast Pumping Tips °If you are breastfeeding, there may be times when you cannot feed your baby directly. Returning to work or going on a trip are common examples. Pumping allows you to store breast milk and feed it to your baby later.  °You may not get much milk when you first start to pump. Your breasts should start to make more after a few days. If you pump at the times you usually feed your baby, you may be able to keep making enough milk to feed your baby without also using formula. The more often you pump, the more milk you will produce.  °WHEN SHOULD I PUMP?  °· You can begin to pump soon after delivery. However, some experts recommend waiting about 4 weeks before giving your infant a bottle to make sure breastfeeding is going well.  °· If you plan to return to work, begin pumping a few weeks before. This will help you develop techniques that work best for you. It also lets you build up a supply of breast milk.   °· When you are with your infant, feed on demand and pump after each feeding.   °· When you are away from your infant for several hours, pump for about 15 minutes every 2-3 hours. Pump both breasts at the same time if you can.   °· If your infant has a formula feeding, make sure to pump around the same time.     °· If you drink any alcohol, wait 2 hours before pumping.   °HOW DO I PREPARE TO PUMP? °Your let-down reflex is the natural reaction to stimulation that makes your breast milk flow. It is easier to stimulate this reflex when you are relaxed. Find relaxation techniques that work for you. If you have difficulty with your let-down reflex, try these methods:  °· Smell one of your infant's blankets or an item of clothing.   °· Look at a picture or video of your infant.   °· Sit in a quiet, private space.   °· Massage the breast you plan to pump.   °· Place soothing warmth on the breast.   °· Play relaxing music.   °WHAT ARE SOME GENERAL BREAST PUMPING TIPS? °· Wash your hands before you pump. You  do not need to wash your nipples or breasts. °· There are three ways to pump. °¨ You can use your hand to massage and compress your breast. °¨ You can use a handheld manual pump. °¨ You can use an electric pump.   °· Make sure the suction cup (flange) on the breast pump is the right size. Place the flange directly over the nipple. If it is the wrong size or placed the wrong way, it may be painful and cause nipple damage.   °· If pumping is uncomfortable, apply a small amount of purified or modified lanolin to your nipple and areola. °· If you are using an electric pump, adjust the speed and suction power to be more comfortable. °· If pumping is painful or if you are not getting very much milk, you may need a different type of pump. A lactation consultant can help you determine what type of pump to use.   °· Keep a full water bottle near you at all times. Drinking lots of fluid helps you make more milk.  °· You can store your milk to use later. Pumped breast milk can be stored in a sealable, sterile container or plastic bag. Label all stored breast milk with the date you pumped it. °¨ Milk can stay out at room temperature for up to 8 hours. °¨   You can store your milk in the refrigerator for up to 8 days. °¨ You can store your milk in the freezer for 3 months. Thaw frozen milk using warm water. Do not put it in the microwave. °· Do not smoke. Smoking can lower your milk supply and harm your infant. If you need help quitting, ask your health care provider to recommend a program.   °WHEN SHOULD I CALL MY HEALTH CARE PROVIDER OR A LACTATION CONSULTANT? °· You are having trouble pumping. °· You are concerned that you are not making enough milk. °· You have nipple pain, soreness, or redness. °· You want to use birth control. Birth control pills may lower your milk supply. Talk to your health care provider about your options. °Document Released: 06/14/2009 Document Revised: 12/30/2012 Document Reviewed:  10/17/2012 °Exit[employer]® Patient Information ©2015 [employer]. This information is not intended to replace advice given to you by your health care provider. Make sure you discuss any questions you have with your health care provider. ° °Nutrition for the New Mother  °A new mother needs good health and nutrition so she can have energy to take care of a new baby. Whether a mother breastfeeds or formula feeds the baby, it is important to have a well-balanced diet. Foods from all the food groups should be chosen to meet the new mother's energy needs and to give her the nutrients needed for repair and healing.  °A HEALTHY EATING PLAN °The My [employer] plan for Moms outlines what you should eat to help you and your baby stay healthy. The energy and amount of food you need depends on whether or not you are breastfeeding. If you are breastfeeding you will need more nutrients. If you choose not to breastfeed, your nutrition goal should be to return to a healthy weight. Limiting calories may be needed if you are not breastfeeding.  °HOME CARE INSTRUCTIONS  °· For a personal plan based on your unique needs, see your Registered Dietitian or visit www.mypyramid.gov. °· Eat a variety of foods. The plan below will help guide you. The following chart has a suggested daily meal plan from the [employer] for Moms. °· Eat a variety of fruits and vegetables. °· Eat more dark green and orange vegetables and cooked dried beans. °· Make half your grains whole grains. Choose whole instead of refined grains. °· Choose low-fat or lean meats and poultry. °· Choose low-fat or fat-free dairy products like milk, cheese, or yogurt. °Fruits °· Breastfeeding: 2 cups °· Non-Breastfeeding: 2 cups °· What Counts as a serving? °¨ 1 cup of fruit or juice. °¨ ½ cup dried fruit. °Vegetables °· Breastfeeding: 3 cups °· Non-Breastfeeding: 2 ½ cups °· What Counts as a serving? °¨ 1 cup raw or cooked vegetables. °¨ Juice or 2 cups raw leafy  vegetables. °Grains °· Breastfeeding: 8 oz °· Non-Breastfeeding: 6 oz °· What Counts as a serving? °¨ 1 slice bread. °¨ 1 oz ready-to-eat cereal. °¨ ½ cup cooked pasta, rice, or cereal. °Meat and Beans °· Breastfeeding: 6 ½ oz °· Non-Breastfeeding: 5 ½ oz °· What Counts as a serving? °¨ 1 oz lean meat, poultry, or fish °¨ ¼ cup cooked dry beans °¨ ½ oz nuts or 1 egg °¨ 1 tbs peanut butter °Milk °· Breastfeeding: 3 cups °· Non-Breastfeeding: 3 cups °· What Counts as a serving? °¨ 1 cup milk. °¨ 8 oz yogurt. °¨ 1 ½ oz cheese. °¨ 2 oz processed cheese. °TIPS FOR THE BREASTFEEDING MOM °· Rapid weight   loss is not suggested when you are breastfeeding. By simply breastfeeding, you will be able to lose the weight gained during your pregnancy. Your caregiver can keep track of your weight and tell you if your weight loss is appropriate. °· Be sure to drink fluids. You may notice that you are thirstier than usual. A suggestion is to drink a glass of water or other beverage whenever you breastfeed. °· Avoid alcohol as it can be passed into your breast milk. °· Limit caffeine drinks to no more than 2 to 3 cups per day. °· You may need to keep taking your prenatal vitamin while you are breastfeeding. Talk with your caregiver about taking a vitamin or supplement. °RETURING TO A HEALTHY WEIGHT °· The My [employer] Plan for Moms will help you return to a healthy weight. It will also provide the nutrients you need. °· You may need to limit "empty" calories. These include: °¨ High fat foods like fried foods, fatty meats, fast food, butter, and mayonnaise. °¨ High sugar foods like sodas, jelly, candy, and sweets. °· Be physically active. Include 30 minutes of exercise or more each day. Choose an activity you like such as walking, swimming, biking, or aerobics. Check with your caregiver before you start to exercise. °Document Released: 04/03/2007 Document Revised: 03/19/2011 Document Reviewed: 04/03/2007 °ExitCare® Patient Information  ©2015 [employer]. This information is not intended to replace advice given to you by your health care provider. Make sure you discuss any questions you have with your health care provider. °Postpartum Depression and Baby Blues °The postpartum period begins right after the birth of a baby. During this time, there is often a great amount of joy and excitement. It is also a time of many changes in the life of the parents. Regardless of how many times a mother gives birth, each child brings new challenges and dynamics to the family. It is not unusual to have feelings of excitement along with confusing shifts in moods, emotions, and thoughts. All mothers are at risk of developing postpartum depression or the "baby blues." These mood changes can occur right after giving birth, or they may occur many months after giving birth. The baby blues or postpartum depression can be mild or severe. Additionally, postpartum depression can go away rather quickly, or it can be a long-term condition.  °CAUSES °Raised hormone levels and the rapid drop in those levels are thought to be a main cause of postpartum depression and the baby blues. A number of hormones change during and after pregnancy. Estrogen and progesterone usually decrease right after the delivery of your baby. The levels of thyroid hormone and various cortisol steroids also rapidly drop. Other factors that play a role in these mood changes include major life events and genetics.  °RISK FACTORS °If you have any of the following risks for the baby blues or postpartum depression, know what symptoms to watch out for during the postpartum period. Risk factors that may increase the likelihood of getting the baby blues or postpartum depression include: °· Having a personal or family history of depression.   °· Having depression while being pregnant.   °· Having premenstrual mood issues or mood issues related to oral contraceptives. °· Having a lot of life stress.   °· Having  marital conflict.   °· Lacking a social support network.   °· Having a baby with special needs.   °· Having health problems, such as diabetes.   °SIGNS AND SYMPTOMS °Symptoms of baby blues include: °· Brief changes in mood, such as going   from extreme happiness to sadness. °· Decreased concentration.   °· Difficulty sleeping.   °· Crying spells, tearfulness.   °· Irritability.   °· Anxiety.   °Symptoms of postpartum depression typically begin within the first month after giving birth. These symptoms include: °· Difficulty sleeping or excessive sleepiness.   °· Marked weight loss.   °· Agitation.   °· Feelings of worthlessness.   °· Lack of interest in activity or food.   °Postpartum psychosis is a very serious condition and can be dangerous. Fortunately, it is rare. Displaying any of the following symptoms is cause for immediate medical attention. Symptoms of postpartum psychosis include:  °· Hallucinations and delusions.   °· Bizarre or disorganized behavior.   °· Confusion or disorientation.   °DIAGNOSIS  °A diagnosis is made by an evaluation of your symptoms. There are no medical or lab tests that lead to a diagnosis, but there are various questionnaires that a health care provider may use to identify those with the baby blues, postpartum depression, or psychosis. Often, a screening tool called the Edinburgh Postnatal Depression Scale is used to diagnose depression in the postpartum period.  °TREATMENT °The baby blues usually goes away on its own in 1-2 weeks. Social support is often all that is needed. You will be encouraged to get adequate sleep and rest. Occasionally, you may be given medicines to help you sleep.  °Postpartum depression requires treatment because it can last several months or longer if it is not treated. Treatment may include individual or group therapy, medicine, or both to address any social, physiological, and psychological factors that may play a role in the depression. Regular exercise, a  healthy diet, rest, and social support may also be strongly recommended.  °Postpartum psychosis is more serious and needs treatment right away. Hospitalization is often needed. °HOME CARE INSTRUCTIONS °· Get as much rest as you can. Nap when the baby sleeps.   °· Exercise regularly. Some women find yoga and walking to be beneficial.   °· Eat a balanced and nourishing diet.   °· Do little things that you enjoy. Have a cup of tea, take a bubble bath, read your favorite magazine, or listen to your favorite music. °· Avoid alcohol.   °· Ask for help with household chores, cooking, grocery shopping, or running errands as needed. Do not try to do everything.   °· Talk to people close to you about how you are feeling. Get support from your partner, family members, friends, or other new moms. °· Try to stay positive in how you think. Think about the things you are grateful for.   °· Do not spend a lot of time alone.   °· Only take over-the-counter or prescription medicine as directed by your health care provider. °· Keep all your postpartum appointments.   °· Let your health care provider know if you have any concerns.   °SEEK MEDICAL CARE IF: °You are having a reaction to or problems with your medicine. °SEEK IMMEDIATE MEDICAL CARE IF: °· You have suicidal feelings.   °· You think you may harm the baby or someone else. °MAKE SURE YOU: °· Understand these instructions. °· Will watch your condition. °· Will get help right away if you are not doing well or get worse. °Document Released: 09/29/2003 Document Revised: 12/30/2012 Document Reviewed: 10/06/2012 °ExitCare® Patient Information ©2015 [employer]. This information is not intended to replace advice given to you by your health care provider. Make sure you discuss any questions you have with your health care provider. °Breastfeeding and Mastitis °Mastitis is inflammation of the breast tissue. It can occur in women who   are breastfeeding. This can make breastfeeding  painful. Mastitis will sometimes go away on its own. Your health care provider will help determine if treatment is needed. °CAUSES °Mastitis is often associated with a blocked milk (lactiferous) duct. This can happen when too much milk builds up in the breast. Causes of excess milk in the breast can include: °· Poor latch-on. If your baby is not latched onto the breast properly, she or he may not empty your breast completely while breastfeeding. °· Allowing too much time to pass between feedings. °· Wearing a bra or other clothing that is too tight. This puts extra pressure on the lactiferous ducts so milk does not flow through them as it should. °Mastitis can also be caused by a bacterial infection. Bacteria may enter the breast tissue through cuts or openings in the skin. In women who are breastfeeding, this may occur because of cracked or irritated skin. Cracks in the skin are often caused when your baby does not latch on properly to the breast. °SIGNS AND SYMPTOMS °· Swelling, redness, tenderness, and pain in an area of the breast. °· Swelling of the glands under the arm on the same side. °· Fever may or may not accompany mastitis. °If an infection is allowed to progress, a collection of pus (abscess) may develop. °DIAGNOSIS  °Your health care provider can usually diagnose mastitis based on your symptoms and a physical exam. Tests may be done to help confirm the diagnosis. These may include: °· Removal of pus from the breast by applying pressure to the area. This pus can be examined in the lab to determine which bacteria are present. If an abscess has developed, the fluid in the abscess can be removed with a needle. This can also be used to confirm the diagnosis and determine the bacteria present. In most cases, pus will not be present. °· Blood tests to determine if your body is fighting a bacterial infection. °· Mammogram or ultrasound tests to rule out other problems or diseases. °TREATMENT  °Mastitis that  occurs with breastfeeding will sometimes go away on its own. Your health care provider may choose to wait 24 hours after first seeing you to decide whether a prescription medicine is needed. If your symptoms are worse after 24 hours, your health care provider will likely prescribe an antibiotic medicine to treat the mastitis. He or she will determine which bacteria are most likely causing the infection and will then select an appropriate antibiotic medicine. This is sometimes changed based on the results of tests performed to identify the bacteria, or if there is no response to the antibiotic medicine selected. Antibiotic medicines are usually given by mouth. You may also be given medicine for pain. °HOME CARE INSTRUCTIONS °· Only take over-the-counter or prescription medicines for pain, fever, or discomfort as directed by your health care provider. °· If your health care provider prescribed an antibiotic medicine, take the medicine as directed. Make sure you finish it even if you start to feel better. °· Do not wear a tight or underwire bra. Wear a soft, supportive bra. °· Increase your fluid intake, especially if you have a fever. °· Continue to empty the breast. Your health care provider can tell you whether this milk is safe for your infant or needs to be thrown out. You may be told to stop nursing until your health care provider thinks it is safe for your baby. Use a breast pump if you are advised to stop nursing. °· Keep your nipples   clean and dry. °· Empty the first breast completely before going to the other breast. If your baby is not emptying your breasts completely for some reason, use a breast pump to empty your breasts. °· If you go back to work, pump your breasts while at work to stay in time with your nursing schedule. °· Avoid allowing your breasts to become overly filled with milk (engorged). °SEEK MEDICAL CARE IF: °· You have pus-like discharge from the breast. °· Your symptoms do not improve with  the treatment prescribed by your health care provider within 2 days. °SEEK IMMEDIATE MEDICAL CARE IF: °· Your pain and swelling are getting worse. °· You have pain that is not controlled with medicine. °· You have a red line extending from the breast toward your armpit. °· You have a fever or persistent symptoms for more than 2-3 days. °· You have a fever and your symptoms suddenly get worse. °MAKE SURE YOU:  °· Understand these instructions. °· Will watch your condition. °· Will get help right away if you are not doing well or get worse. °Document Released: 04/21/2004 Document Revised: 12/30/2012 Document Reviewed: 07/31/2012 °[employer]® Patient Information ©2015 [employer]. This information is not intended to replace advice given to you by your health care provider. Make sure you discuss any questions you have with your health care provider. °Breastfeeding °Deciding to breastfeed is one of the best choices you can make for you and your baby. A change in hormones during pregnancy causes your breast tissue to grow and increases the number and size of your milk ducts. These hormones also allow proteins, sugars, and fats from your blood supply to make breast milk in your milk-producing glands. Hormones prevent breast milk from being released before your baby is born as well as prompt milk flow after birth. Once breastfeeding has begun, thoughts of your baby, as well as his or her sucking or crying, can stimulate the release of milk from your milk-producing glands.  °BENEFITS OF BREASTFEEDING °For Your Baby °· Your first milk (colostrum) helps your baby's digestive system function better.   °· There are antibodies in your milk that help your baby fight off infections.   °· Your baby has a lower incidence of asthma, allergies, and sudden infant death syndrome.   °· The nutrients in breast milk are better for your baby than infant formulas and are designed uniquely for your baby's needs.   °· Breast milk improves your  baby's brain development.   °· Your baby is less likely to develop other conditions, such as childhood obesity, asthma, or type 2 diabetes mellitus.   °For You  °· Breastfeeding helps to create a very special bond between you and your baby.   °· Breastfeeding is convenient. Breast milk is always available at the correct temperature and costs nothing.   °· Breastfeeding helps to burn calories and helps you lose the weight gained during pregnancy.   °· Breastfeeding makes your uterus contract to its prepregnancy size faster and slows bleeding (lochia) after you give birth.   °· Breastfeeding helps to lower your risk of developing type 2 diabetes mellitus, osteoporosis, and breast or ovarian cancer later in life. °SIGNS THAT YOUR BABY IS HUNGRY °Early Signs of Hunger  °· Increased alertness or activity. °· Stretching. °· Movement of the head from side to side. °· Movement of the head and opening of the mouth when the corner of the mouth or cheek is stroked (rooting). °· Increased sucking sounds, smacking lips, cooing, sighing, or squeaking. °· Hand-to-mouth movements. °· Increased sucking of   fingers or hands. °Late Signs of Hunger °· Fussing. °· Intermittent crying. °Extreme Signs of Hunger °Signs of extreme hunger will require calming and consoling before your baby will be able to breastfeed successfully. Do not wait for the following signs of extreme hunger to occur before you initiate breastfeeding:   °· Restlessness. °· A loud, strong cry. °·  Screaming. °BREASTFEEDING BASICS °Breastfeeding Initiation °· Find a comfortable place to sit or lie down, with your neck and back well supported. °· Place a pillow or rolled up blanket under your baby to bring him or her to the level of your breast (if you are seated). Nursing pillows are specially designed to help support your arms and your baby while you breastfeed. °· Make sure that your baby's abdomen is facing your abdomen.   °· Gently massage your breast. With your  fingertips, massage from your chest wall toward your nipple in a circular motion. This encourages milk flow. You may need to continue this action during the feeding if your milk flows slowly. °· Support your breast with 4 fingers underneath and your thumb above your nipple. Make sure your fingers are well away from your nipple and your baby's mouth.   °· Stroke your baby's lips gently with your finger or nipple.   °· When your baby's mouth is open wide enough, quickly bring your baby to your breast, placing your entire nipple and as much of the colored area around your nipple (areola) as possible into your baby's mouth.   °¨ More areola should be visible above your baby's upper lip than below the lower lip.   °¨ Your baby's tongue should be between his or her lower gum and your breast.   °· Ensure that your baby's mouth is correctly positioned around your nipple (latched). Your baby's lips should create a seal on your breast and be turned out (everted). °· It is common for your baby to suck about 2-3 minutes in order to start the flow of breast milk. °Latching °Teaching your baby how to latch on to your breast properly is very important. An improper latch can cause nipple pain and decreased milk supply for you and poor weight gain in your baby. Also, if your baby is not latched onto your nipple properly, he or she may swallow some air during feeding. This can make your baby fussy. Burping your baby when you switch breasts during the feeding can help to get rid of the air. However, teaching your baby to latch on properly is still the best way to prevent fussiness from swallowing air while breastfeeding. °Signs that your baby has successfully latched on to your nipple:    °· Silent tugging or silent sucking, without causing you pain.   °· Swallowing heard between every 3-4 sucks.   °·  Muscle movement above and in front of his or her ears while sucking.   °Signs that your baby has not successfully latched on to  nipple:  °· Sucking sounds or smacking sounds from your baby while breastfeeding. °· Nipple pain. °If you think your baby has not latched on correctly, slip your finger into the corner of your baby's mouth to break the suction and place it between your baby's gums. Attempt breastfeeding initiation again. °Signs of Successful Breastfeeding °Signs from your baby:   °· A gradual decrease in the number of sucks or complete cessation of sucking.   °· Falling asleep.   °· Relaxation of his or her body.   °· Retention of a small amount of milk in his or her mouth.   °· Letting go   of your breast by himself or herself. °Signs from you: °· Breasts that have increased in firmness, weight, and size 1-3 hours after feeding.   °· Breasts that are softer immediately after breastfeeding. °· Increased milk volume, as well as a change in milk consistency and color by the fifth day of breastfeeding.   °· Nipples that are not sore, cracked, or bleeding. °Signs That Your Baby is Getting Enough Milk °· Wetting at least 3 diapers in a 24-hour period. The urine should be clear and pale yellow by age 5 days. °· At least 3 stools in a 24-hour period by age 5 days. The stool should be soft and yellow. °· At least 3 stools in a 24-hour period by age 7 days. The stool should be seedy and yellow. °· No loss of weight greater than 10% of birth weight during the first 3 days of age. °· Average weight gain of 4-7 ounces (113-198 g) per week after age 4 days. °· Consistent daily weight gain by age 5 days, without weight loss after the age of 2 weeks. °After a feeding, your baby may spit up a small amount. This is common. °BREASTFEEDING FREQUENCY AND DURATION °Frequent feeding will help you make more milk and can prevent sore nipples and breast engorgement. Breastfeed when you feel the need to reduce the fullness of your breasts or when your baby shows signs of hunger. This is called "breastfeeding on demand." Avoid introducing a pacifier to your  baby while you are working to establish breastfeeding (the first 4-6 weeks after your baby is born). After this time you may choose to use a pacifier. Research has shown that pacifier use during the first year of a baby's life decreases the risk of sudden infant death syndrome (SIDS). °Allow your baby to feed on each breast as long as he or she wants. Breastfeed until your baby is finished feeding. When your baby unlatches or falls asleep while feeding from the first breast, offer the second breast. Because newborns are often sleepy in the first few weeks of life, you may need to awaken your baby to get him or her to feed. °Breastfeeding times will vary from baby to baby. However, the following rules can serve as a guide to help you ensure that your baby is properly fed: °· Newborns (babies 4 weeks of age or younger) may breastfeed every 1-3 hours. °· Newborns should not go longer than 3 hours during the day or 5 hours during the night without breastfeeding. °· You should breastfeed your baby a minimum of 8 times in a 24-hour period until you begin to introduce solid foods to your baby at around 6 months of age. °BREAST MILK PUMPING °Pumping and storing breast milk allows you to ensure that your baby is exclusively fed your breast milk, even at times when you are unable to breastfeed. This is especially important if you are going back to work while you are still breastfeeding or when you are not able to be present during feedings. Your lactation consultant can give you guidelines on how long it is safe to store breast milk.  °A breast pump is a machine that allows you to pump milk from your breast into a sterile bottle. The pumped breast milk can then be stored in a refrigerator or freezer. Some breast pumps are operated by hand, while others use electricity. Ask your lactation consultant which type will work best for you. Breast pumps can be purchased, but some hospitals and breastfeeding support groups   lease  breast pumps on a monthly basis. A lactation consultant can teach you how to hand express breast milk, if you prefer not to use a pump.  °CARING FOR YOUR BREASTS WHILE YOU BREASTFEED °Nipples can become dry, cracked, and sore while breastfeeding. The following recommendations can help keep your breasts moisturized and healthy: °· Avoid using soap on your nipples.   °· Wear a supportive bra. Although not required, special nursing bras and tank tops are designed to allow access to your breasts for breastfeeding without taking off your entire bra or top. Avoid wearing underwire-style bras or extremely tight bras. °· Air dry your nipples for 3-4 minutes after each feeding.   °· Use only cotton bra pads to absorb leaked breast milk. Leaking of breast milk between feedings is normal.   °· Use lanolin on your nipples after breastfeeding. Lanolin helps to maintain your skin's normal moisture barrier. If you use pure lanolin, you do not need to wash it off before feeding your baby again. Pure lanolin is not toxic to your baby. You may also hand express a few drops of breast milk and gently massage that milk into your nipples and allow the milk to air dry. °In the first few weeks after giving birth, some women experience extremely full breasts (engorgement). Engorgement can make your breasts feel heavy, warm, and tender to the touch. Engorgement peaks within 3-5 days after you give birth. The following recommendations can help ease engorgement: °· Completely empty your breasts while breastfeeding or pumping. You may want to start by applying warm, moist heat (in the shower or with warm water-soaked hand towels) just before feeding or pumping. This increases circulation and helps the milk flow. If your baby does not completely empty your breasts while breastfeeding, pump any extra milk after he or she is finished. °· Wear a snug bra (nursing or regular) or tank top for 1-2 days to signal your body to slightly decrease milk  production. °· Apply ice packs to your breasts, unless this is too uncomfortable for you. °· Make sure that your baby is latched on and positioned properly while breastfeeding. °If engorgement persists after 48 hours of following these recommendations, contact your health care provider or a lactation consultant. °OVERALL HEALTH CARE RECOMMENDATIONS WHILE BREASTFEEDING °· Eat healthy foods. Alternate between meals and snacks, eating 3 of each per day. Because what you eat affects your breast milk, some of the foods may make your baby more irritable than usual. Avoid eating these foods if you are sure that they are negatively affecting your baby. °· Drink milk, fruit juice, and water to satisfy your thirst (about 10 glasses a day).   °· Rest often, relax, and continue to take your prenatal vitamins to prevent fatigue, stress, and anemia. °· Continue breast self-awareness checks. °· Avoid chewing and smoking tobacco. °· Avoid alcohol and drug use. °Some medicines that may be harmful to your baby can pass through breast milk. It is important to ask your health care provider before taking any medicine, including all over-the-counter and prescription medicine as well as vitamin and herbal supplements. °It is possible to become pregnant while breastfeeding. If birth control is desired, ask your health care provider about options that will be safe for your baby. °SEEK MEDICAL CARE IF:  °· You feel like you want to stop breastfeeding or have become frustrated with breastfeeding. °· You have painful breasts or nipples. °· Your nipples are cracked or bleeding. °· Your breasts are red, tender, or warm. °· You have   a swollen area on either breast. °· You have a fever or chills. °· You have nausea or vomiting. °· You have drainage other than breast milk from your nipples. °· Your breasts do not become full before feedings by the fifth day after you give birth. °· You feel sad and depressed. °· Your baby is too sleepy to eat  well. °· Your baby is having trouble sleeping.   °· Your baby is wetting less than 3 diapers in a 24-hour period. °· Your baby has less than 3 stools in a 24-hour period. °· Your baby's skin or the white part of his or her eyes becomes yellow.   °· Your baby is not gaining weight by 5 days of age. °SEEK IMMEDIATE MEDICAL CARE IF:  °· Your baby is overly tired (lethargic) and does not want to wake up and feed. °· Your baby develops an unexplained fever. °Document Released: 12/25/2004 Document Revised: 12/30/2012 Document Reviewed: 06/18/2012 °ExitCare® Patient Information ©2015 [employer]. This information is not intended to replace advice given to you by your health care provider. Make sure you discuss any questions you have with your health care provider. ° °

## 2013-12-30 NOTE — Discharge Summary (Signed)
Obstetric Discharge Summary  Reason for Admission: Pt is a G3P3003 at 2649w2d admitted for induction of labor [facility] and Type DM.  [facility] since 11.3 wks, with Marlinda Mikeanya Bailey, CNM as primary provider.  Medications on Admission: Prescriptions prior to admission  Medication Sig Dispense Refill Last Dose  . folic acid (FOLVITE) 800 MCG tablet Take 800 mcg by mouth daily.   Past Week at Unknown time  . metformin (FORTAMET) 1000 MG (OSM) 24 hr tablet Take 1,000 mg by mouth daily with breakfast.   Past Week at Unknown time  . NIFEdipine (PROCARDIA-XL/ADALAT-CC/NIFEDICAL-XL) 30 MG 24 hr tablet Take 30 mg by mouth daily.   12/27/2013 at Unknown time  . FOLIC ACID PO Take 1 tablet by mouth daily.   08/24/2013 at Unknown time  . [DISCONTINUED] cephALEXin (KEFLEX) 500 MG capsule Take 1 capsule (500 mg total) by mouth 3 (three) times daily. (Patient not taking: Reported on 12/28/2013) 30 capsule 0 Completed Course at Unknown time  . [DISCONTINUED] nitrofurantoin, macrocrystal-monohydrate, (MACROBID) 100 MG capsule Take 1 capsule (100 mg total) by mouth every 12 (twelve) hours. (Patient not taking: Reported on 12/28/2013) 14 capsule 0 Completed Course at Unknown time  . [DISCONTINUED] phenazopyridine (PYRIDIUM) 100 MG tablet Take 1 tablet (100 mg total) by mouth 3 (three) times daily with meals. (Patient not taking: Reported on 12/28/2013) 10 tablet 0 Completed Course at Unknown time    Prenatal Labs: ABO, Rh: O POS (12/21 0900)  Antibody: NEG (12/21 0900) Rubella: Immune (06/09 0000)   RPR: NON REAC (12/21 0900)  HBsAg: Negative (06/09 0000)  HIV: Non-reactive (06/09 0000)  GTT : Not done / Abnormal HgB A1C 6.2 GBS: Negative (11/23 0000)   Prenatal Procedures: NST and ultrasound Intrapartum Course: Admitted for IOL d/t CHTN and Type 2 DM / Pitocin and AROM - clear fluid / rapid progression to complete dilation / SVD of viable female by Marlinda Mikeanya Bailey, CNM  / no  immediate postpartum complications noted. Intrapartum Procedures: spontaneous vaginal delivery Postpartum Procedures: none Complications-Operative and Postpartum: none  Labs: HEMOGLOBIN  Date Value Ref Range Status  12/28/2013 11.8* 12.0 - 15.0 g/dL Final   HCT  Date Value Ref Range Status  12/28/2013 35.4* 36.0 - 46.0 % Final   Lab Results  Component Value Date   PLT 151 12/28/2013    Newborn Data: Live born female  Birth Weight: 7 lb 13.2 oz (3550 g) APGAR: 8, 9  Home with mother.   Discharge Information: Date: 12/30/2013 Discharge Diagnoses:  Pt is a G3P3003 at 1949w2d S/P Term Pregnancy-delivered on 12/28/2013  Condition: stable Activity: pelvic rest Diet: routine Medications:    Medication List    STOP taking these medications        folic acid 800 MCG tablet  Commonly known as:  FOLVITE     FOLIC ACID PO      TAKE these medications        hydrochlorothiazide 25 MG tablet  Commonly known as:  HYDRODIURIL  Take 1 tablet (25 mg total) by mouth daily.     ibuprofen 600 MG tablet  Commonly known as:  ADVIL,MOTRIN  Take 1 tablet (600 mg total) by mouth every 6 (six) hours.     metformin 1000 MG (OSM) 24 hr tablet  Commonly known as:  FORTAMET  Take 1 tablet (1,000 mg total) by mouth at bedtime.     NIFEdipine 30 MG 24 hr tablet  Commonly known as:  PROCARDIA-XL/ADALAT-CC/NIFEDICAL-XL  Take 30 mg by mouth daily.       Instructions: [facility] booklet has been given and reviewed Discharge to: home     Follow-up Information    Follow up with Marlinda MikeBAILEY, TANYA, CNM. Schedule an appointment as soon as possible for a visit in 1 week.   Specialty:  Obstetrics and Gynecology   Why:  blood pressure recheck   Contact information:   [address] [place] [address] [phone]       Follow up with Marlinda MikeBAILEY, TANYA, CNM. Schedule an appointment as soon as possible for a visit in 6 weeks.   Specialty:  Obstetrics and Gynecology    Why:  postpartum visit   Contact information:   [address] [place] [address] [phone]       Raelyn MoraAWSON, Tyshea Imel, Judie PetitM, MSN, CNM 12/30/2013, 8:50 AM

## 2013-12-30 NOTE — Progress Notes (Signed)
Patient ID: Natalie [name], female   DOB: 12-24-1985, 28 y.o.   MRN: [id number] PPD # 2 SVD  S:  Reports feeling well and ready to go home             Tolerating po/ No nausea or vomiting             Bleeding is light - increased today             Pain controlled with ibuprofen (OTC)             Up ad lib / ambulatory / voiding without difficulties    Newborn  Information for the patient's newborn:  Julious OkaSlade, Girl [name] [[id number]][[id number]]  female  breast feeding     O:  A & O x 3, in no apparent distress              VS:  Filed Vitals:   12/29/13 0614 12/29/13 0948 12/29/13 1718 12/30/13 0623  BP: 143/90 127/47 126/78 101/64  Pulse: 108 100 113 98  Temp: 97.4 F (36.3 C) 98.4 F (36.9 C) 98.3 F (36.8 C) 97.8 F (36.6 C)  TempSrc: Oral Oral Oral Oral  Resp: 20  20 18   Height:      Weight:      SpO2:    100%    LABS:  Recent Labs  12/28/13 0900  WBC 9.3  HGB 11.8*  HCT 35.4*  PLT 151    Blood type: O POS (12/21 0900)  Rubella: Immune (06/09 0000)   I&O: I/O last 3 completed shifts: In: -  Out: 150 [Blood:150]             Lungs: Clear and unlabored  Heart: regular rate and rhythm / no murmurs  Abdomen: soft, non-tender, non-distended              Fundus: firm, non-tender, U-2  Perineum: intact, no edema  Lochia: no  Extremities: 2+ edema, no calf pain or tenderness, no Homans    A/P: PPD # 2  28 y.o., [id number]   Principal Problem:   Postpartum care following vaginal delivery (12/21) Active Problems:   Type 2 diabetes mellitus affecting pregnancy in third trimester, antepartum   SVD (spontaneous vaginal delivery)   Dependent edema   Chronic hypertension in pregnancy   Doing well - stable status  Routine post partum orders  Anticipate discharge tomorrow    Raelyn MoraAWSON, Dorsey Authement, M, MSN, CNM 12/30/2013, 8:41 AM

## 2013-12-30 NOTE — Lactation Note (Signed)
This note was copied from the chart of Natalie [name] Bounds. Lactation Consultation Note   Mother states she is not longer breastfeeding. Offered information regarding engorgement and cabbage leaves and mother states she knows.  Patient Name: Natalie [name] ZOXWR'UToday's Date: 12/30/2013     Maternal Data    Feeding Feeding Type: Bottle Fed - Formula Nipple Type: Slow - flow  LATCH Score/Interventions                      Lactation Tools Discussed/Used     Consult Status      [name], Natalie Boschen 12/30/2013, 11:32 AM

## 2014-06-25 LAB — OB RESULTS CONSOLE ABO/RH: RH Type: POSITIVE

## 2014-06-25 LAB — OB RESULTS CONSOLE HIV ANTIBODY (ROUTINE TESTING): HIV: NONREACTIVE

## 2014-06-25 LAB — OB RESULTS CONSOLE GC/CHLAMYDIA
CHLAMYDIA, DNA PROBE: NEGATIVE
GC PROBE AMP, GENITAL: NEGATIVE

## 2014-06-25 LAB — OB RESULTS CONSOLE RPR: RPR: NONREACTIVE

## 2014-06-25 LAB — OB RESULTS CONSOLE HEPATITIS B SURFACE ANTIGEN: Hepatitis B Surface Ag: NEGATIVE

## 2014-06-25 LAB — OB RESULTS CONSOLE RUBELLA ANTIBODY, IGM: RUBELLA: IMMUNE

## 2014-06-25 LAB — OB RESULTS CONSOLE ANTIBODY SCREEN: ANTIBODY SCREEN: NEGATIVE

## 2014-11-08 ENCOUNTER — Inpatient Hospital Stay (HOSPITAL_COMMUNITY)
Admission: AD | Admit: 2014-11-08 | Discharge: 2014-11-08 | Disposition: A | Discharge status: Home / Self Care | Payer: 59 | Source: Ambulatory Visit | Attending: Obstetrics & Gynecology | Admitting: Obstetrics & Gynecology

## 2014-11-08 ENCOUNTER — Encounter (HOSPITAL_COMMUNITY): Payer: Self-pay

## 2014-11-08 ENCOUNTER — Inpatient Hospital Stay (HOSPITAL_COMMUNITY): Payer: 59

## 2014-11-08 DIAGNOSIS — Z3A27 27 weeks gestation of pregnancy: Secondary | ICD-10-CM | POA: Diagnosis not present

## 2014-11-08 DIAGNOSIS — O322XX0 Maternal care for transverse and oblique lie, not applicable or unspecified: Secondary | ICD-10-CM | POA: Diagnosis not present

## 2014-11-08 DIAGNOSIS — O24312 Unspecified pre-existing diabetes mellitus in pregnancy, second trimester: Secondary | ICD-10-CM | POA: Insufficient documentation

## 2014-11-08 DIAGNOSIS — O10912 Unspecified pre-existing hypertension complicating pregnancy, second trimester: Secondary | ICD-10-CM | POA: Insufficient documentation

## 2014-11-08 DIAGNOSIS — K5900 Constipation, unspecified: Secondary | ICD-10-CM | POA: Insufficient documentation

## 2014-11-08 DIAGNOSIS — O99212 Obesity complicating pregnancy, second trimester: Secondary | ICD-10-CM | POA: Diagnosis not present

## 2014-11-08 DIAGNOSIS — R109 Unspecified abdominal pain: Secondary | ICD-10-CM

## 2014-11-08 DIAGNOSIS — M549 Dorsalgia, unspecified: Secondary | ICD-10-CM | POA: Insufficient documentation

## 2014-11-08 DIAGNOSIS — O26899 Other specified pregnancy related conditions, unspecified trimester: Secondary | ICD-10-CM

## 2014-11-08 DIAGNOSIS — E119 Type 2 diabetes mellitus without complications: Secondary | ICD-10-CM | POA: Insufficient documentation

## 2014-11-08 LAB — URINALYSIS, ROUTINE W REFLEX MICROSCOPIC
Bilirubin Urine: NEGATIVE
Glucose, UA: NEGATIVE mg/dL
Ketones, ur: NEGATIVE mg/dL
NITRITE: NEGATIVE
Protein, ur: NEGATIVE mg/dL
UROBILINOGEN UA: 0.2 mg/dL (ref 0.0–1.0)
pH: 5.5 (ref 5.0–8.0)

## 2014-11-08 LAB — URINE MICROSCOPIC-ADD ON

## 2014-11-08 NOTE — Discharge Instructions (Signed)

## 2014-11-08 NOTE — MAU Note (Signed)
Pt c/o abdominal cramping, back pain and contractions that started at 12am. Feels contractions "often but not timing." Denies vag bleeding or LOF. +FM.

## 2014-11-08 NOTE — MAU Note (Signed)
Urine sent to lab @ 0526.

## 2014-11-08 NOTE — MAU Provider Note (Signed)
History     CSN: [id number]  Arrival date and time: 11/08/14 7829  Provider on unit: 0610 Provider at bedside: 0612     Chief Complaint  Patient presents with  . Abdominal Cramping  . Back Pain   HPI Comments: Ms. Lari Linson is a 29 yo G4P3003 female at 27.5 wks by ultrasound, presenting with complaints of cramping since midnight.  Cramping is not relieved by warm bath, rest, hydration, and Tylenol. Denies VB or LOF. Her prenatal care is complicated by: CHTN, Diabetes, Morbid Obesity, and closely spaced pregnancies.  Her primary OB providers at [facility] is Marlinda Mike, CNM and Dr. Juliene Pina.  Abdominal Cramping This is a new problem. The current episode started today. The onset quality is sudden. The problem occurs constantly. The problem has been unchanged. Pain location: pelvic. The pain is at a severity of 6/10. The pain is moderate. The quality of the pain is cramping. The abdominal pain does not radiate. Nothing aggravates the pain. The pain is relieved by nothing. Treatments tried: warm bath, hydration and Tylenol. The treatment provided no relief.  Back Pain    Past Medical History  Diagnosis Date  . Depression   . Morbid obesity (HCC)   . Hx of varicella   . Gestational diabetes mellitus, delivered 11/28/2012  . Headache(784.0)   . Infection     UTI  . Gestational diabetes     metformin  . Pregnancy induced hypertension     Past Surgical History  Procedure Laterality Date  . No past surgeries      Family History  Problem Relation Age of Onset  . Hypothyroidism Mother   . Asthma Mother   . Bipolar disorder Mother   . Diabetes Mother   . Stroke Father   . Hypertension Father   . Seizures Father   . Asthma Sister   . Asthma Brother   . Asthma Daughter   . Diabetes Maternal Uncle   . Diabetes Maternal Grandmother   . Cancer Maternal Grandmother     breast  . Heart disease Paternal Grandfather   . Cancer Paternal Grandfather   . Heart attack Paternal  Grandfather     Social History  Substance Use Topics  . Smoking status: Never Smoker   . Smokeless tobacco: Never Used  . Alcohol Use: No    Allergies: No Known Allergies  Prescriptions prior to admission  Medication Sig Dispense Refill Last Dose  . hydrochlorothiazide (HYDRODIURIL) 25 MG tablet Take 1 tablet (25 mg total) by mouth daily. 5 tablet 0   . ibuprofen (ADVIL,MOTRIN) 600 MG tablet Take 1 tablet (600 mg total) by mouth every 6 (six) hours. 30 tablet 1   . metFORMIN (FORTAMET) 1000 MG (OSM) 24 hr tablet Take 1 tablet (1,000 mg total) by mouth at bedtime. 30 tablet 3   . NIFEdipine (PROCARDIA-XL/ADALAT-CC/NIFEDICAL-XL) 30 MG 24 hr tablet Take 30 mg by mouth daily.   12/27/2013 at Unknown time    Review of Systems  Constitutional: Negative.   HENT: Negative.   Eyes: Negative.   Cardiovascular: Negative.   Gastrointestinal: Negative.   Genitourinary:       Uterine cramps since midnight  Musculoskeletal: Positive for back pain.  Skin: Negative.   Endo/Heme/Allergies: Negative.   Psychiatric/Behavioral: Negative.    Recent Results (from the past 2160 hour(s))  Urinalysis, Routine w reflex microscopic (not at [facility])     Status: Abnormal   Collection Time: 11/08/14  5:22 AM  Result Value Ref Range  Color, Urine YELLOW YELLOW   APPearance CLEAR CLEAR   Specific Gravity, Urine <1.005 (L) 1.005 - 1.030   pH 5.5 5.0 - 8.0   Glucose, UA NEGATIVE NEGATIVE mg/dL   Hgb urine dipstick TRACE (A) NEGATIVE   Bilirubin Urine NEGATIVE NEGATIVE   Ketones, ur NEGATIVE NEGATIVE mg/dL   Protein, ur NEGATIVE NEGATIVE mg/dL   Urobilinogen, UA 0.2 0.0 - 1.0 mg/dL   Nitrite NEGATIVE NEGATIVE   Leukocytes, UA MODERATE (A) NEGATIVE  Urine microscopic-add on     Status: Abnormal   Collection Time: 11/08/14  5:22 AM  Result Value Ref Range   Squamous Epithelial / LPF RARE RARE   WBC, UA 3-6 <3 WBC/hpf   Bacteria, UA FEW (A) RARE   CEFM  FHR: 140 bpm / moderate variability / accels  present / decels absent TOCO: no UC's or UI noted  OB Limited U/S  CL: 3.3 cm Fetal Lie: Transverse - head to maternal RT  Physical Exam   Blood pressure 131/90, pulse 115, temperature 98.5 F (36.9 C), temperature source Oral, resp. rate 20, height 5\' 3"  (1.6 m), weight 143.246 kg (315 lb 12.8 oz), SpO2 100%.  Physical Exam  Constitutional: She is oriented to person, place, and time. She appears well-developed.  HENT:  Head: Normocephalic.  Eyes: Pupils are equal, round, and reactive to light.  Neck: Normal range of motion.  Cardiovascular: Normal rate, regular rhythm and normal heart sounds.   Respiratory: Effort normal and breath sounds normal.  GI: Soft. Bowel sounds are normal.  Genitourinary:  Gravid; S>D; VE: closed/40%/suspected transverse d/t flattened lower uterine segment  Musculoskeletal: Normal range of motion.  Neurological: She is alert and oriented to person, place, and time.  Skin: Skin is warm and dry.  Psychiatric: She has a normal mood and affect. Her behavior is normal. Judgment and thought content normal.    MAU Course  Procedures CEFM OB Limited U/S for CL Assessment and Plan  G4P3003 at 27.[redacted] wks gestation Preterm Uterine Cramps Back pain in pregnancy Mild Constipation Transverse Lie of Fetus Category 1 FHR tracing  Discharge Home PTL precautions reviewed Increase hydration, rest, Tyelnol 1000 mg every 8 hrs for back pain Encouraged to increase fiber and water intake / attempt strong effort to have more regular BMs / consider magnesium oxide 200 mg hs  Keep scheduled appointment with [facility]  Raelyn MoraAWSON, Sanyiah Kanzler, M MSN, CNM 11/08/2014, 6:36 AM

## 2014-12-31 LAB — OB RESULTS CONSOLE GBS: GBS: NEGATIVE

## 2015-01-06 ENCOUNTER — Other Ambulatory Visit: Payer: Self-pay | Admitting: Certified Nurse Midwife

## 2015-01-09 NOTE — L&D Delivery Note (Signed)
Delivery Note  First Stage: Labor induction - chronic hypertension / class B DM Labor start 0930 Augmentation : none Analgesia /Anesthesia intrapartum: IV Nubain x1 dose ( ) AROM at 1404 - clear  Second Stage: Complete dilation at 1755 Onset of pushing at 1755 FHR second stage category 1  Delivery of a viable female at 23 by CNM in ROA position no nuchal cord Cord double clamped after cessation of pulsation, cut by [facility] Cord blood sample collected   Third Stage: Placenta delivered [facility] intact with 3 VC @ 1811 Placenta disposition: hospital disposal Uterine tone firm / bleeding small  no laceration identified   Est. Blood Loss (mL): 300  Complications: none  Mom to postpartum.  Baby to Couplet care / Skin to Skin.  Newborn: Birth Weight: 8 pounds 11.3 ounces Apgar Scores: 8-9 Feeding planned: breast  Marlinda Mike CNM, MSN, FACNM 01/27/2015, 9:24 PM

## 2015-01-24 ENCOUNTER — Inpatient Hospital Stay (HOSPITAL_COMMUNITY)
Admission: AD | Admit: 2015-01-24 | Discharge: 2015-01-24 | Disposition: A | Discharge status: Home / Self Care | Payer: 59 | Source: Ambulatory Visit | Attending: Obstetrics & Gynecology | Admitting: Obstetrics & Gynecology

## 2015-01-24 ENCOUNTER — Inpatient Hospital Stay (HOSPITAL_COMMUNITY): Payer: 59

## 2015-01-24 ENCOUNTER — Encounter (HOSPITAL_COMMUNITY): Payer: Self-pay | Admitting: *Deleted

## 2015-01-24 DIAGNOSIS — O133 Gestational [pregnancy-induced] hypertension without significant proteinuria, third trimester: Secondary | ICD-10-CM

## 2015-01-24 DIAGNOSIS — K529 Noninfective gastroenteritis and colitis, unspecified: Secondary | ICD-10-CM | POA: Diagnosis present

## 2015-01-24 DIAGNOSIS — O10019 Pre-existing essential hypertension complicating pregnancy, unspecified trimester: Secondary | ICD-10-CM

## 2015-01-24 DIAGNOSIS — O10913 Unspecified pre-existing hypertension complicating pregnancy, third trimester: Secondary | ICD-10-CM

## 2015-01-24 DIAGNOSIS — O169 Unspecified maternal hypertension, unspecified trimester: Secondary | ICD-10-CM

## 2015-01-24 DIAGNOSIS — O1092 Unspecified pre-existing hypertension complicating childbirth: Secondary | ICD-10-CM | POA: Diagnosis not present

## 2015-01-24 DIAGNOSIS — O99213 Obesity complicating pregnancy, third trimester: Secondary | ICD-10-CM

## 2015-01-24 DIAGNOSIS — Z3A39 39 weeks gestation of pregnancy: Secondary | ICD-10-CM

## 2015-01-24 DIAGNOSIS — O24419 Gestational diabetes mellitus in pregnancy, unspecified control: Secondary | ICD-10-CM

## 2015-01-24 HISTORY — DX: Noninfective gastroenteritis and colitis, unspecified: K52.9

## 2015-01-24 LAB — URINALYSIS, ROUTINE W REFLEX MICROSCOPIC
GLUCOSE, UA: NEGATIVE mg/dL
Nitrite: NEGATIVE
PH: 6 (ref 5.0–8.0)
Protein, ur: 100 mg/dL — AB
Specific Gravity, Urine: >1.03 — ABNORMAL HIGH (ref 1.005–1.030)

## 2015-01-24 LAB — URINE MICROSCOPIC-ADD ON: RBC / HPF: NONE SEEN RBC/hpf (ref 0–5)

## 2015-01-24 LAB — GLUCOSE, CAPILLARY: Glucose-Capillary: 113 mg/dL — ABNORMAL HIGH (ref 65–99)

## 2015-01-24 MED ORDER — LACTATED RINGERS IV SOLN
25.0000 mg | Freq: Once | INTRAVENOUS | Status: AC
  Administered 2015-01-24: 25 mg via INTRAVENOUS
  Filled 2015-01-24: qty 1

## 2015-01-24 MED ORDER — M.V.I. ADULT IV INJ
Freq: Once | INTRAVENOUS | Status: AC
  Administered 2015-01-24: 11:00:00 via INTRAVENOUS
  Filled 2015-01-24: qty 1000

## 2015-01-24 NOTE — Progress Notes (Signed)
Patient very restless. Unable to maintain continuous FHR tracing.

## 2015-01-24 NOTE — Progress Notes (Signed)
CBG: 113 °

## 2015-01-24 NOTE — Progress Notes (Signed)
Patient states she has not been able to eat due to V/D. States she is gest. diabetic on metformin. States when she doesn't eat, her heart rate goes up. Patient's HR auscultated to verify rate. FHR 150's.

## 2015-01-24 NOTE — Progress Notes (Signed)
Carloyn Jaeger. Dawson, CNM in to explain to pt that induction postponed till Thursday to allow her to recover from this virus.

## 2015-01-24 NOTE — MAU Provider Note (Signed)
History     CSN: [id number]  Arrival date and time: 01/24/15 0835  Provider notified: 0920 Provider on unit: 1050 Provider at bedside: 1100     Chief Complaint  Patient presents with  . Nausea  . Emesis  . Diarrhea   HPI  [name] is a 30 yo G4P3003 female at 39.[redacted] wks gestation by LMP and ultrasound, presenting with complaints N/V/D since 1900 yesterday.  She reports having 20 episodes of vomiting and 15 episodes of diarrhea.  She denies being around anyone that was to her knowledge.  She was at her sister-in-law's house yesterday and "had to run to the BR as soon as she got home". She reports weakness, fatigue and "feeling dehydrated". (+) FM. Denies VB or LOF.   Past Medical History  Diagnosis Date  . Depression   . Morbid obesity (HCC)   . Hx of varicella   . Gestational diabetes mellitus, delivered 11/28/2012  . Headache(784.0)   . Infection     UTI  . Gestational diabetes     metformin  . Pregnancy induced hypertension   . Gastroenteritis, acute 01/24/2015    Past Surgical History  Procedure Laterality Date  . No past surgeries      Family History  Problem Relation Age of Onset  . Hypothyroidism Mother   . Asthma Mother   . Bipolar disorder Mother   . Diabetes Mother   . Stroke Father   . Hypertension Father   . Seizures Father   . Asthma Sister   . Asthma Brother   . Asthma Daughter   . Diabetes Maternal Uncle   . Diabetes Maternal Grandmother   . Cancer Maternal Grandmother     breast  . Heart disease Paternal Grandfather   . Cancer Paternal Grandfather   . Heart attack Paternal Grandfather     Social History  Substance Use Topics  . Smoking status: Never Smoker   . Smokeless tobacco: Never Used  . Alcohol Use: No    Allergies: No Known Allergies  Prescriptions prior to admission  Medication Sig Dispense Refill Last Dose  . FLUoxetine (PROZAC) 10 MG capsule Take 10 mg by mouth daily.   01/22/2015  . folic acid (FOLVITE) 400 MCG  tablet Take 400 mcg by mouth daily.   Past Week at Unknown time  . metFORMIN (GLUCOPHAGE-XR) 750 MG 24 hr tablet Take 750 mg by mouth 2 (two) times daily.   01/22/2015    Review of Systems  Constitutional: Positive for malaise/fatigue.  HENT: Negative.   Eyes: Negative.   Respiratory: Negative.   Cardiovascular: Negative.   Gastrointestinal: Positive for nausea, vomiting and diarrhea.       20 episodes of vomiting and 15 episodes of diarrhea since last night ~ 1900; none since arrival to hospital  Genitourinary: Negative.   Musculoskeletal: Negative.   Skin: Negative.   Neurological: Positive for weakness.  Endo/Heme/Allergies: Negative.   Psychiatric/Behavioral: Negative.    Results for orders placed or performed during the hospital encounter of 01/24/15 (from the past 24 hour(s))  Urinalysis, Routine w reflex microscopic ([facility])     Status: Abnormal   Collection Time: 01/24/15  8:41 AM  Result Value Ref Range   Color, Urine YELLOW YELLOW   APPearance CLOUDY (A) CLEAR   Specific Gravity, Urine >1.030 (H) 1.005 - 1.030   pH 6.0 5.0 - 8.0   Glucose, UA NEGATIVE NEGATIVE mg/dL   Hgb urine dipstick TRACE (A) NEGATIVE   Bilirubin Urine  SMALL (A) NEGATIVE   Ketones, ur >80 (A) NEGATIVE mg/dL   Protein, ur 161 (A) NEGATIVE mg/dL   Nitrite NEGATIVE NEGATIVE   Leukocytes, UA MODERATE (A) NEGATIVE  Urine microscopic-add on     Status: Abnormal   Collection Time: 01/24/15  8:41 AM  Result Value Ref Range   Squamous Epithelial / LPF 6-30 (A) NONE SEEN   WBC, UA 6-30 0 - 5 WBC/hpf   RBC / HPF NONE SEEN 0 - 5 RBC/hpf   Bacteria, UA FEW (A) NONE SEEN  Glucose, capillary     Status: Abnormal   Collection Time: 01/24/15  9:27 AM  Result Value Ref Range   Glucose-Capillary 113 (H) 65 - 99 mg/dL   [place] Mfm Fetal Bpp Wo Non Stress AFI = 25.67 (mild polyhydramnios), BPP = 8/8  CEFM  FHR: 145 bpm / moderate variability / accels present / decels absent TOCO: Occ UI noted   Physical  Exam   Blood pressure 147/46, pulse 134, temperature 98.3 F (36.8 C), temperature source Oral, resp. rate 18, SpO2 100 %.  Physical Exam  Constitutional: She is oriented to person, place, and time. She appears well-developed and well-nourished.  Morbidly obese  HENT:  Head: Normocephalic and atraumatic.  Neck: Normal range of motion.  Cardiovascular: Normal rate, regular rhythm and normal heart sounds.   Respiratory: Effort normal and breath sounds normal.  GI: Soft. Bowel sounds are normal.  Genitourinary:  Pelvic deferred  Musculoskeletal: Normal range of motion.  Neurological: She is alert and oriented to person, place, and time. She has normal reflexes.  Skin: Skin is warm and dry.  Psychiatric: She has a normal mood and affect. Her behavior is normal. Judgment and thought content normal.    MAU Course  Procedures CEFM CCUA LR with Phenergan 25 mg 1000 mL bolus rate D5LR with 1 amp MVI at 500 mL/hr AFI/BPP  Assessment and Plan  30 yo G4P3003 at 39.[redacted] wks gestation CHTN affecting pregnancy, third trimester Type II DM affecting pregnancy, third trimester Gastroenteritis Category I FHR tracing  Discharge Home Advance diet slowly / B.R.A.T. Diet Limit physical contact with family members x 24 hrs / practice good hygiene and have someone sanitize the house with bleach  IOL changed from 1/17/to 1/19 - T. Fredric Mare, CNM and Dr. Juliene Pina notified Call the office with any further questions, problems or concerns  Kenard Gower MSN, CNM 01/24/2015, 11:26 AM

## 2015-01-24 NOTE — MAU Note (Signed)
Pt states has been vomiting and having diarrhea since 1900 last night.

## 2015-01-24 NOTE — Discharge Instructions (Signed)
Keep your scheduled appointment for induction. Call the office or provider on call with further concerns.

## 2015-01-24 NOTE — Progress Notes (Signed)
IV infusing. Patient's HR 120's. States she is starting to feel better.

## 2015-01-25 ENCOUNTER — Inpatient Hospital Stay (HOSPITAL_COMMUNITY): Admission: RE | Admit: 2015-01-25 | Payer: 59 | Source: Ambulatory Visit

## 2015-01-26 ENCOUNTER — Encounter (HOSPITAL_COMMUNITY): Payer: Self-pay | Admitting: *Deleted

## 2015-01-26 ENCOUNTER — Telehealth (HOSPITAL_COMMUNITY): Payer: Self-pay | Admitting: *Deleted

## 2015-01-26 NOTE — Telephone Encounter (Signed)
Preadmission screen  

## 2015-01-27 ENCOUNTER — Inpatient Hospital Stay (HOSPITAL_COMMUNITY)
Admission: AD | Admit: 2015-01-27 | Discharge: 2015-01-29 | DRG: 774 | Disposition: A | Discharge status: Home / Self Care | Payer: 59 | Source: Ambulatory Visit | Attending: Obstetrics & Gynecology | Admitting: Obstetrics & Gynecology

## 2015-01-27 ENCOUNTER — Encounter (HOSPITAL_COMMUNITY): Payer: Self-pay

## 2015-01-27 DIAGNOSIS — O3663X0 Maternal care for excessive fetal growth, third trimester, not applicable or unspecified: Secondary | ICD-10-CM | POA: Diagnosis present

## 2015-01-27 DIAGNOSIS — D696 Thrombocytopenia, unspecified: Secondary | ICD-10-CM | POA: Diagnosis present

## 2015-01-27 DIAGNOSIS — O99019 Anemia complicating pregnancy, unspecified trimester: Secondary | ICD-10-CM

## 2015-01-27 DIAGNOSIS — O9081 Anemia of the puerperium: Secondary | ICD-10-CM | POA: Diagnosis not present

## 2015-01-27 DIAGNOSIS — Z3A38 38 weeks gestation of pregnancy: Secondary | ICD-10-CM | POA: Diagnosis not present

## 2015-01-27 DIAGNOSIS — O9912 Other diseases of the blood and blood-forming organs and certain disorders involving the immune mechanism complicating childbirth: Secondary | ICD-10-CM | POA: Diagnosis present

## 2015-01-27 DIAGNOSIS — R609 Edema, unspecified: Secondary | ICD-10-CM | POA: Diagnosis present

## 2015-01-27 DIAGNOSIS — E876 Hypokalemia: Secondary | ICD-10-CM | POA: Diagnosis not present

## 2015-01-27 DIAGNOSIS — D62 Acute posthemorrhagic anemia: Secondary | ICD-10-CM | POA: Diagnosis not present

## 2015-01-27 DIAGNOSIS — O1092 Unspecified pre-existing hypertension complicating childbirth: Principal | ICD-10-CM | POA: Diagnosis present

## 2015-01-27 DIAGNOSIS — D509 Iron deficiency anemia, unspecified: Secondary | ICD-10-CM | POA: Diagnosis present

## 2015-01-27 DIAGNOSIS — O24425 Gestational diabetes mellitus in childbirth, controlled by oral hypoglycemic drugs: Secondary | ICD-10-CM | POA: Diagnosis present

## 2015-01-27 DIAGNOSIS — O24119 Pre-existing diabetes mellitus, type 2, in pregnancy, unspecified trimester: Secondary | ICD-10-CM | POA: Diagnosis present

## 2015-01-27 DIAGNOSIS — O169 Unspecified maternal hypertension, unspecified trimester: Secondary | ICD-10-CM | POA: Diagnosis present

## 2015-01-27 LAB — CBC
HCT: 35 % — ABNORMAL LOW (ref 36.0–46.0)
Hemoglobin: 11.3 g/dL — ABNORMAL LOW (ref 12.0–15.0)
MCH: 26.6 pg (ref 26.0–34.0)
MCHC: 32.3 g/dL (ref 30.0–36.0)
MCV: 82.4 fL (ref 78.0–100.0)
Platelets: 159 10*3/uL (ref 150–400)
RBC: 4.25 MIL/uL (ref 3.87–5.11)
RDW: 16.2 % — ABNORMAL HIGH (ref 11.5–15.5)
WBC: 6 10*3/uL (ref 4.0–10.5)

## 2015-01-27 LAB — COMPREHENSIVE METABOLIC PANEL
ALT: 14 U/L (ref 14–54)
AST: 23 U/L (ref 15–41)
Albumin: 2.9 g/dL — ABNORMAL LOW (ref 3.5–5.0)
Alkaline Phosphatase: 129 U/L — ABNORMAL HIGH (ref 38–126)
Anion gap: 9 (ref 5–15)
BUN: <5 mg/dL — ABNORMAL LOW (ref 6–20)
CO2: 17 mmol/L — ABNORMAL LOW (ref 22–32)
Calcium: 8.8 mg/dL — ABNORMAL LOW (ref 8.9–10.3)
Chloride: 110 mmol/L (ref 101–111)
Creatinine, Ser: 0.59 mg/dL (ref 0.44–1.00)
GFR calc Af Amer: >60 mL/min (ref >60)
GFR calc non Af Amer: >60 mL/min (ref >60)
Glucose, Bld: 118 mg/dL — ABNORMAL HIGH (ref 65–99)
Potassium: 3.4 mmol/L — ABNORMAL LOW (ref 3.5–5.1)
Sodium: 136 mmol/L (ref 135–145)
Total Bilirubin: 0.5 mg/dL (ref 0.3–1.2)
Total Protein: 6.5 g/dL (ref 6.5–8.1)

## 2015-01-27 LAB — GLUCOSE, CAPILLARY
GLUCOSE-CAPILLARY: 108 mg/dL — AB (ref 65–99)
GLUCOSE-CAPILLARY: 134 mg/dL — AB (ref 65–99)
Glucose-Capillary: 94 mg/dL (ref 65–99)

## 2015-01-27 LAB — LACTATE DEHYDROGENASE: LDH: 146 U/L (ref 98–192)

## 2015-01-27 LAB — URIC ACID: Uric Acid, Serum: 6.4 mg/dL (ref 2.3–6.6)

## 2015-01-27 MED ORDER — LANOLIN HYDROUS EX OINT: TOPICAL_OINTMENT | CUTANEOUS | Status: DC | PRN

## 2015-01-27 MED ORDER — METFORMIN HCL ER 750 MG PO TB24
750.0000 mg | ORAL_TABLET | Freq: Two times a day (BID) | ORAL | Status: DC
  Administered 2015-01-28 (×2): 750 mg via ORAL
  Filled 2015-01-27 (×5): qty 1

## 2015-01-27 MED ORDER — CITRIC ACID-SODIUM CITRATE 334-500 MG/5ML PO SOLN: 30.0000 mL | ORAL | Status: DC | PRN

## 2015-01-27 MED ORDER — LACTATED RINGERS IV SOLN: 500.0000 mL | INTRAVENOUS | Status: DC | PRN

## 2015-01-27 MED ORDER — OXYTOCIN 10 UNIT/ML IJ SOLN
2.5000 [IU]/h | INTRAVENOUS | Status: DC
  Administered 2015-01-27: 2.5 [IU]/h via INTRAVENOUS
  Filled 2015-01-27: qty 4

## 2015-01-27 MED ORDER — OXYCODONE-ACETAMINOPHEN 5-325 MG PO TABS: 2.0000 | ORAL_TABLET | ORAL | Status: DC | PRN

## 2015-01-27 MED ORDER — OXYTOCIN BOLUS FROM INFUSION
500.0000 mL | INTRAVENOUS | Status: DC
Start: 2015-01-27 — End: 2015-01-27
  Administered 2015-01-27: 500 mL via INTRAVENOUS

## 2015-01-27 MED ORDER — OXYTOCIN 40 UNITS IN LACTATED RINGERS INFUSION - SIMPLE MED
1.0000 m[IU]/min | INTRAVENOUS | Status: DC
  Filled 2015-01-27: qty 1000

## 2015-01-27 MED ORDER — TERBUTALINE SULFATE 1 MG/ML IJ SOLN
0.2500 mg | Freq: Once | INTRAMUSCULAR | Status: DC | PRN
  Filled 2015-01-27: qty 1

## 2015-01-27 MED ORDER — OXYCODONE HCL 5 MG PO TABS
5.0000 mg | ORAL_TABLET | ORAL | Status: DC | PRN
  Administered 2015-01-28 (×3): 5 mg via ORAL
  Filled 2015-01-27 (×3): qty 1

## 2015-01-27 MED ORDER — WITCH HAZEL-GLYCERIN EX PADS: 1.0000 "application " | MEDICATED_PAD | CUTANEOUS | Status: DC | PRN

## 2015-01-27 MED ORDER — SIMETHICONE 80 MG PO CHEW: 80.0000 mg | CHEWABLE_TABLET | ORAL | Status: DC | PRN

## 2015-01-27 MED ORDER — OXYCODONE HCL 5 MG PO TABS
10.0000 mg | ORAL_TABLET | ORAL | Status: DC | PRN
  Administered 2015-01-28: 10 mg via ORAL
  Filled 2015-01-27: qty 2

## 2015-01-27 MED ORDER — ONDANSETRON HCL 4 MG/2ML IJ SOLN
4.0000 mg | Freq: Four times a day (QID) | INTRAMUSCULAR | Status: DC | PRN
  Administered 2015-01-27: 4 mg via INTRAVENOUS
  Filled 2015-01-27: qty 2

## 2015-01-27 MED ORDER — LACTATED RINGERS IV SOLN
INTRAVENOUS | Status: DC
  Administered 2015-01-27: 10:00:00 via INTRAVENOUS

## 2015-01-27 MED ORDER — BENZOCAINE-MENTHOL 20-0.5 % EX AERO: 1.0000 "application " | INHALATION_SPRAY | CUTANEOUS | Status: DC | PRN

## 2015-01-27 MED ORDER — SENNOSIDES-DOCUSATE SODIUM 8.6-50 MG PO TABS
2.0000 | ORAL_TABLET | ORAL | Status: DC
  Administered 2015-01-28: 2 via ORAL
  Filled 2015-01-27: qty 2

## 2015-01-27 MED ORDER — DIBUCAINE 1 % RE OINT: 1.0000 "application " | TOPICAL_OINTMENT | RECTAL | Status: DC | PRN

## 2015-01-27 MED ORDER — OXYTOCIN 40 UNITS IN LACTATED RINGERS INFUSION - SIMPLE MED
1.0000 m[IU]/min | INTRAVENOUS | Status: DC
  Administered 2015-01-27: 2 m[IU]/min via INTRAVENOUS
  Filled 2015-01-27: qty 1000

## 2015-01-27 MED ORDER — ACETAMINOPHEN 325 MG PO TABS: 650.0000 mg | ORAL_TABLET | ORAL | Status: DC | PRN

## 2015-01-27 MED ORDER — FLUOXETINE HCL 10 MG PO CAPS
10.0000 mg | ORAL_CAPSULE | Freq: Every day | ORAL | Status: DC
  Administered 2015-01-28: 10 mg via ORAL
  Filled 2015-01-27 (×3): qty 1

## 2015-01-27 MED ORDER — OXYCODONE-ACETAMINOPHEN 5-325 MG PO TABS: 1.0000 | ORAL_TABLET | ORAL | Status: DC | PRN

## 2015-01-27 MED ORDER — NALBUPHINE HCL 10 MG/ML IJ SOLN
5.0000 mg | INTRAMUSCULAR | Status: DC | PRN
  Administered 2015-01-27 (×2): 5 mg via INTRAVENOUS
  Filled 2015-01-27 (×2): qty 1

## 2015-01-27 MED ORDER — LIDOCAINE HCL (PF) 1 % IJ SOLN
30.0000 mL | INTRAMUSCULAR | Status: DC | PRN
  Filled 2015-01-27: qty 30

## 2015-01-27 MED ORDER — IBUPROFEN 600 MG PO TABS
600.0000 mg | ORAL_TABLET | Freq: Four times a day (QID) | ORAL | Status: DC
  Administered 2015-01-27 – 2015-01-29 (×7): 600 mg via ORAL
  Filled 2015-01-27 (×8): qty 1

## 2015-01-27 NOTE — Progress Notes (Signed)
S:  Ctx uncomfortable  O:  VS: Blood pressure 157/81, pulse 126, temperature 97.6 F (36.4 C), temperature source Oral, resp. rate 20, height  (1.6 m), weight 142.883 kg (315 lb), SpO2 96 %, unknown if currently breastfeeding.        FHR : baseline 130 / variability moderate / accelerations + / no decelerations                   Difficulty with monitor continuously        Toco: contractions every 2-4 minutes / moderate         Cervix : 6/90.vtx-1        Membranes: AROM 1755  A: active labor     FHR category 1  P: expectant management     Marlinda Mike CNM, MSN, [facility]

## 2015-01-27 NOTE — Progress Notes (Signed)
Fetal heart rate difficult to trace due to maternal movent and body habitus. 2 RNs continuously at bedside and monitoring patient and fetus.

## 2015-01-27 NOTE — Progress Notes (Signed)
Fetal heart rate difficult to trace due to maternal movent and body habitus. 2 RNs continuously at bedside and monitoring patient and fetus.  

## 2015-01-27 NOTE — H&P (Signed)
OB ADMISSION/ HISTORY & PHYSICAL:  Admission Date: 01/27/2015  7:22 AM  Admit Diagnosis: 38.6 weeks / class B DM (pre-existing) / chronic hypertension / morbid obesity  Natalie [name] is a 30 y.o. female presenting for induction of labor. Previously scheduled for IOL earlier this week due to patient having GI virus.  Prenatal History: [id number]   EDC : 02/02/2015, by Other Basis  Prenatal care at [facility]  Primary Ob Provider: Kathi Ludwig and Dr Juliene Pina Prenatal course complicated by morbid obesity / class B DM (managed on Metformin 750XR BID) / chronic hypertension with labile BP - no medication                                                       depression related to grief after death of Father during pregnancy  Prenatal Labs: ABO, Rh: O/Positive/-- (06/17 0000) Antibody: Negative (06/17 0000) Rubella: Immune (06/17 0000)  RPR: Nonreactive (06/17 0000)  HBsAg: Negative (06/17 0000)  HIV: Non-reactive (06/17 0000)  GTT: ABNORMAL - A1c 6.4 (3rd trimester) GBS: Negative (12/23 0000)   SONO (37wks): LGA @ 93% with EFW 7-12 / AFI 18 / cephalic presentation  Medical / Surgical History :  Past medical history:  Past Medical History  Diagnosis Date  . Depression   . Morbid obesity (HCC)   . Hx of varicella   . Gestational diabetes mellitus, delivered 11/28/2012  . Headache(784.0)   . Infection     UTI  . Gestational diabetes     metformin  . Pregnancy induced hypertension   . Gastroenteritis, acute 01/24/2015    Past surgical history:  Past Surgical History  Procedure Laterality Date  . No past surgeries     Family History:  Family History  Problem Relation Age of Onset  . Hypothyroidism Mother   . Asthma Mother   . Bipolar disorder Mother   . Diabetes Mother   . Stroke Father   . Hypertension Father   . Seizures Father   . Cancer Father   . Asthma Sister   . Asthma Brother   . Asthma Daughter   . Diabetes Maternal Uncle   . Diabetes Maternal  Grandmother   . Cancer Maternal Grandmother     breast  . Heart disease Paternal Grandfather   . Cancer Paternal Grandfather   . Heart attack Paternal Grandfather   . Cancer Maternal Aunt     Social History:  reports that she has never smoked. She has never used smokeless tobacco. She reports that she does not drink alcohol or use illicit drugs.  Allergies: Review of patient's allergies indicates no known allergies.   Current Medications at time of admission:  Prior to Admission medications   Medication Sig Last dose Taking? Authorizing Provider  FLUoxetine (PROZAC) 10 MG capsule Take 10 mg by mouth daily. 36wks no Historical Provider, MD  folic acid (FOLVITE) 400 MCG tablet Take 400 mcg by mouth daily. yesterday yes Historical Provider, MD  metFORMIN (GLUCOPHAGE-XR) 750 MG 24 hr tablet Take 750 mg by mouth 2 (two) times daily. yesterday AM dose yes Historical Provider, MD   Review of Systems: Active FM Few mild braxton-hicks ctx No LOF bloody show absent  Physical Exam:  VS: Blood pressure 140/79, pulse 119, temperature 97.3 F (36.3 C), temperature source Oral, resp. rate 18, height   (1.6 m), weight 142.883 kg (315 lb), SpO2 98 %, unknown if currently breastfeeding.  General: alert and oriented, appears calm and comfortable Heart: RRR Lungs: Clear lung fields Abdomen: Gravid, soft and non-tender, non-distended / uterus: gravid / large pendulous panus Extremities: 2+ pedal edema  Genitalia / VE: Dilation: 2 Effacement (%): 70, 80 Station: -3 Exam by:: Marlinda Mike, CNM  FHR: baseline rate 140 / variability moderate / accelerations + / no decelerations TOCO: rare ctx  Assessment: 38.[redacted] weeks gestation Chronic hypertension - labile BP / no medication Class B DM - poor compliance this pregnancy (A1c increased from 6.0 to 6.4)  LGA with proven pelvis Induction of labor with favorable Bishop's score FHR category 1   Plan:  Admit Pitocin induction with AROM IV  analgesia as needed (Nubain ordered) Baseline PIH labs Strict I&O throughout hospitalization CBG Q 4 hours - manage with insulin if CBG >160  Dr Juliene Pina aware of admission / plan of care   Marlinda Mike CNM, MSN, [facility], [facility] 01/27/2015, 8:37 AM

## 2015-01-28 DIAGNOSIS — O24119 Pre-existing diabetes mellitus, type 2, in pregnancy, unspecified trimester: Secondary | ICD-10-CM | POA: Diagnosis present

## 2015-01-28 DIAGNOSIS — D509 Iron deficiency anemia, unspecified: Secondary | ICD-10-CM | POA: Diagnosis present

## 2015-01-28 DIAGNOSIS — O99019 Anemia complicating pregnancy, unspecified trimester: Secondary | ICD-10-CM

## 2015-01-28 DIAGNOSIS — D696 Thrombocytopenia, unspecified: Secondary | ICD-10-CM | POA: Diagnosis not present

## 2015-01-28 DIAGNOSIS — R609 Edema, unspecified: Secondary | ICD-10-CM | POA: Diagnosis present

## 2015-01-28 DIAGNOSIS — D62 Acute posthemorrhagic anemia: Secondary | ICD-10-CM | POA: Diagnosis not present

## 2015-01-28 LAB — BASIC METABOLIC PANEL
Anion gap: 8 (ref 5–15)
BUN: <5 mg/dL — ABNORMAL LOW (ref 6–20)
CO2: 18 mmol/L — ABNORMAL LOW (ref 22–32)
Calcium: 8.7 mg/dL — ABNORMAL LOW (ref 8.9–10.3)
Chloride: 110 mmol/L (ref 101–111)
Creatinine, Ser: 0.55 mg/dL (ref 0.44–1.00)
GFR calc Af Amer: >60 mL/min (ref >60)
GFR calc non Af Amer: >60 mL/min (ref >60)
Glucose, Bld: 90 mg/dL (ref 65–99)
Potassium: 3.5 mmol/L (ref 3.5–5.1)
Sodium: 136 mmol/L (ref 135–145)

## 2015-01-28 LAB — CBC
HCT: 29.1 % — ABNORMAL LOW (ref 36.0–46.0)
Hemoglobin: 9.5 g/dL — ABNORMAL LOW (ref 12.0–15.0)
MCH: 26.5 pg (ref 26.0–34.0)
MCHC: 32.6 g/dL (ref 30.0–36.0)
MCV: 81.1 fL (ref 78.0–100.0)
Platelets: 136 10*3/uL — ABNORMAL LOW (ref 150–400)
RBC: 3.59 MIL/uL — ABNORMAL LOW (ref 3.87–5.11)
RDW: 15.9 % — ABNORMAL HIGH (ref 11.5–15.5)
WBC: 9.9 10*3/uL (ref 4.0–10.5)

## 2015-01-28 LAB — GLUCOSE, CAPILLARY
GLUCOSE-CAPILLARY: 97 mg/dL (ref 65–99)
Glucose-Capillary: 84 mg/dL (ref 65–99)
Glucose-Capillary: 89 mg/dL (ref 65–99)
Glucose-Capillary: 95 mg/dL (ref 65–99)
Glucose-Capillary: 106 mg/dL — ABNORMAL HIGH (ref 65–99)

## 2015-01-28 LAB — RPR: RPR Ser Ql: NONREACTIVE

## 2015-01-28 MED ORDER — POTASSIUM CHLORIDE CRYS ER 20 MEQ PO TBCR
20.0000 meq | EXTENDED_RELEASE_TABLET | Freq: Every day | ORAL | Status: DC
  Administered 2015-01-28 – 2015-01-29 (×2): 20 meq via ORAL
  Filled 2015-01-28 (×4): qty 1

## 2015-01-28 MED ORDER — HYDROCHLOROTHIAZIDE 25 MG PO TABS
25.0000 mg | ORAL_TABLET | Freq: Every day | ORAL | Status: DC
  Administered 2015-01-28: 25 mg via ORAL
  Filled 2015-01-28 (×3): qty 1

## 2015-01-28 MED ORDER — POLYSACCHARIDE IRON COMPLEX 150 MG PO CAPS
150.0000 mg | ORAL_CAPSULE | Freq: Every day | ORAL | Status: DC
  Filled 2015-01-28: qty 1

## 2015-01-28 NOTE — Progress Notes (Addendum)
PPD #1- SVD  Subjective:   Reports feeling well Tolerating po/ No nausea or vomiting Bleeding is light Pain controlled with Motrin Up ad lib / ambulatory / voiding without problems Newborn: breastfeeding  / Circumcision: done  Objective:   VS:  VS:  Filed Vitals:   01/27/15 2005 01/27/15 2115 01/28/15 0108 01/28/15 0543  BP: 132/91 127/78 134/81 133/79  Pulse: 105 83 108 100  Temp: 98.2 F (36.8 C) 98.2 F (36.8 C) 97.6 F (36.4 C) 97.7 F (36.5 C)  TempSrc: Oral Oral Oral Oral  Resp: Height:      Weight:      SpO2: 99% 100% 100%     LABS:  Recent Labs  01/27/15 1018 01/28/15 0740  WBC 6.0 9.9  HGB 11.3* 9.5*  PLT 159 136*   Blood type: O/Positive/-- (06/17 0000) Rubella: Immune (06/17 0000)   I&O: Intake/Output      01/19 0701 - 01/20 0700 01/20 0701 - 01/21 0700   P.O. 1800    I.V. (mL/kg) 547.3 (3.8)    Total Intake(mL/kg) 2347.3 (16.4)    Urine (mL/kg/hr) 1800    Blood 300    Total Output 2100     Net +247.3          Urine Occurrence 1 x      Physical Exam: Alert and oriented x3 Abdomen: soft, non-tender, non-distended  Fundus: firm, non-tender, U-2 Perineum: intact, no edema or erythema Lochia: small Extremities: 3+ pedal edema, no calf pain or tenderness   Assessment:  PPD #1 G4P4004/ S/P:induced vaginal   Chronic HTN-stable Class B DM-stable on Metformin Dependent edema Hypokalemia-improving ABL anemia Thrombocytopenia-stable Doing well   Plan: Start HCTZ Start K-Dur Start Niferex Continue routine post partum orders Anticipate D/C home tomorrow Recheck BP check and CMP in office in 1 week   Mckenzi Buonomo, N MSN, CNM 01/28/2015, 11:15 AM

## 2015-01-28 NOTE — Progress Notes (Signed)
MOB was referred for history of depression/anxiety.  Referral is screened out by Clinical Social Worker because none of the following criteria appear to apply: -History of anxiety/depression during this pregnancy, or of post-partum depression. - Diagnosis of anxiety and/or depression within last 3 years - History of depression due to pregnancy loss/loss of child or -MOB's symptoms are currently being treated with medication and/or therapy.  Per chart review, MOB experienced depressive symptoms after the death of her father.  MOB was prescribed Prozac, and reported improvement of symptoms. Upon further review, CSW noted that she presented with a history of greater than 3 years ago AEB CSW screening out referral after previous 2 children had been born.   Please contact the Clinical Social Worker if needs arise or upon MOB request.  

## 2015-01-28 NOTE — Plan of Care (Signed)
Problem: Nutritional: Goal: Mother's verbalization of comfort with breastfeeding process will improve Outcome: Not Applicable Date Met:  50/38/88 Pt has decided to bottlefeed.

## 2015-01-29 LAB — GLUCOSE, CAPILLARY: GLUCOSE-CAPILLARY: 87 mg/dL (ref 65–99)

## 2015-01-29 MED ORDER — POTASSIUM CHLORIDE CRYS ER 20 MEQ PO TBCR: 20.0000 meq | EXTENDED_RELEASE_TABLET | Freq: Every day | ORAL | Status: DC

## 2015-01-29 MED ORDER — POLYSACCHARIDE IRON COMPLEX 150 MG PO CAPS: 150.0000 mg | ORAL_CAPSULE | Freq: Every day | ORAL | Status: DC

## 2015-01-29 MED ORDER — IBUPROFEN 600 MG PO TABS: 600.0000 mg | ORAL_TABLET | Freq: Four times a day (QID) | ORAL | Status: DC

## 2015-01-29 MED ORDER — HYDROCHLOROTHIAZIDE 25 MG PO TABS: 25.0000 mg | ORAL_TABLET | Freq: Every day | ORAL | Status: DC

## 2015-01-29 NOTE — Discharge Instructions (Signed)
Breast Pumping Tips °If you are breastfeeding, there may be times when you cannot feed your baby directly. Returning to work or going on a trip are common examples. Pumping allows you to store breast milk and feed it to your baby later.  °You may not get much milk when you first start to pump. Your breasts should start to make more after a few days. If you pump at the times you usually feed your baby, you may be able to keep making enough milk to feed your baby without also using formula. The more often you pump, the more milk you will produce.  °WHEN SHOULD I PUMP?  °· You can begin to pump soon after delivery. However, some experts recommend waiting about 4 weeks before giving your infant a bottle to make sure breastfeeding is going well.  °· If you plan to return to work, begin pumping a few weeks before. This will help you develop techniques that work best for you. It also lets you build up a supply of breast milk.   °· When you are with your infant, feed on demand and pump after each feeding.   °· When you are away from your infant for several hours, pump for about 15 minutes every 2-3 hours. Pump both breasts at the same time if you can.   °· If your infant has a formula feeding, make sure to pump around the same time.     °· If you drink any alcohol, wait 2 hours before pumping.   °HOW DO I PREPARE TO PUMP? °Your let-down reflex is the natural reaction to stimulation that makes your breast milk flow. It is easier to stimulate this reflex when you are relaxed. Find relaxation techniques that work for you. If you have difficulty with your let-down reflex, try these methods:  °· Smell one of your infant's blankets or an item of clothing.   °· Look at a picture or video of your infant.   °· Sit in a quiet, private space.   °· Massage the breast you plan to pump.   °· Place soothing warmth on the breast.   °· Play relaxing music.   °WHAT ARE SOME GENERAL BREAST PUMPING TIPS? °· Wash your hands before you pump. You  do not need to wash your nipples or breasts. °· There are three ways to pump. °· You can use your hand to massage and compress your breast. °· You can use a handheld manual pump. °· You can use an electric pump.   °· Make sure the suction cup (flange) on the breast pump is the right size. Place the flange directly over the nipple. If it is the wrong size or placed the wrong way, it may be painful and cause nipple damage.   °· If pumping is uncomfortable, apply a small amount of purified or modified lanolin to your nipple and areola. °· If you are using an electric pump, adjust the speed and suction power to be more comfortable. °· If pumping is painful or if you are not getting very much milk, you may need a different type of pump. A lactation consultant can help you determine what type of pump to use.   °· Keep a full water bottle near you at all times. Drinking lots of fluid helps you make more milk.  °· You can store your milk to use later. Pumped breast milk can be stored in a sealable, sterile container or plastic bag. Label all stored breast milk with the date you pumped it. °· Milk can stay out at room temperature for up to 8 hours. °·   You can store your milk in the refrigerator for up to 8 days. °· You can store your milk in the freezer for 3 months. Thaw frozen milk using warm water. Do not put it in the microwave. °· Do not smoke. Smoking can lower your milk supply and harm your infant. If you need help quitting, ask your health care provider to recommend a program.   °WHEN SHOULD I CALL MY HEALTH CARE PROVIDER OR A LACTATION CONSULTANT? °· You are having trouble pumping. °· You are concerned that you are not making enough milk. °· You have nipple pain, soreness, or redness. °· You want to use birth control. Birth control pills may lower your milk supply. Talk to your health care provider about your options. °  °This information is not intended to replace advice given to you by your health care provider.  Make sure you discuss any questions you have with your health care provider. °  °Document Released: 06/14/2009 Document Revised: 12/30/2012 Document Reviewed: 10/17/2012 °Elsevier Interactive Patient Education ©2016 [employer] °Postpartum Depression and Baby Blues °The postpartum period begins right after the birth of a baby. During this time, there is often a great amount of joy and excitement. It is also a time of many changes in the life of the parents. Regardless of how many times a mother gives birth, each child brings new challenges and dynamics to the family. It is not unusual to have feelings of excitement along with confusing shifts in moods, emotions, and thoughts. All mothers are at risk of developing postpartum depression or the "baby blues." These mood changes can occur right after giving birth, or they may occur many months after giving birth. The baby blues or postpartum depression can be mild or severe. Additionally, postpartum depression can go away rather quickly, or it can be a long-term condition.  °CAUSES °Raised hormone levels and the rapid drop in those levels are thought to be a main cause of postpartum depression and the baby blues. A number of hormones change during and after pregnancy. Estrogen and progesterone usually decrease right after the delivery of your baby. The levels of thyroid hormone and various cortisol steroids also rapidly drop. Other factors that play a role in these mood changes include major life events and genetics.  °RISK FACTORS °If you have any of the following risks for the baby blues or postpartum depression, know what symptoms to watch out for during the postpartum period. Risk factors that may increase the likelihood of getting the baby blues or postpartum depression include: °· Having a personal or family history of depression.   °· Having depression while being pregnant.   °· Having premenstrual mood issues or mood issues related to oral  contraceptives. °· Having a lot of life stress.   °· Having marital conflict.   °· Lacking a social support network.   °· Having a baby with special needs.   °· Having health problems, such as diabetes.   °SIGNS AND SYMPTOMS °Symptoms of baby blues include: °· Brief changes in mood, such as going from extreme happiness to sadness. °· Decreased concentration.   °· Difficulty sleeping.   °· Crying spells, tearfulness.   °· Irritability.   °· Anxiety.   °Symptoms of postpartum depression typically begin within the first month after giving birth. These symptoms include: °· Difficulty sleeping or excessive sleepiness.   °· Marked weight loss.   °· Agitation.   °· Feelings of worthlessness.   °· Lack of interest in activity or food.   °Postpartum psychosis is a very serious condition and can be dangerous. Fortunately, it is   rare. Displaying any of the following symptoms is cause for immediate medical attention. Symptoms of postpartum psychosis include:  °· Hallucinations and delusions.   °· Bizarre or disorganized behavior.   °· Confusion or disorientation.   °DIAGNOSIS  °A diagnosis is made by an evaluation of your symptoms. There are no medical or lab tests that lead to a diagnosis, but there are various questionnaires that a health care provider may use to identify those with the baby blues, postpartum depression, or psychosis. Often, a screening tool called the Edinburgh Postnatal Depression Scale is used to diagnose depression in the postpartum period.  °TREATMENT °The baby blues usually goes away on its own in 1-2 weeks. Social support is often all that is needed. You will be encouraged to get adequate sleep and rest. Occasionally, you may be given medicines to help you sleep.  °Postpartum depression requires treatment because it can last several months or longer if it is not treated. Treatment may include individual or group therapy, medicine, or both to address any social, physiological, and psychological factors  that may play a role in the depression. Regular exercise, a healthy diet, rest, and social support may also be strongly recommended.  °Postpartum psychosis is more serious and needs treatment right away. Hospitalization is often needed. °HOME CARE INSTRUCTIONS °· Get as much rest as you can. Nap when the baby sleeps.   °· Exercise regularly. Some women find yoga and walking to be beneficial.   °· Eat a balanced and nourishing diet.   °· Do little things that you enjoy. Have a cup of tea, take a bubble bath, read your favorite magazine, or listen to your favorite music. °· Avoid alcohol.   °· Ask for help with household chores, cooking, grocery shopping, or running errands as needed. Do not try to do everything.   °· Talk to people close to you about how you are feeling. Get support from your partner, family members, friends, or other new moms. °· Try to stay positive in how you think. Think about the things you are grateful for.   °· Do not spend a lot of time alone.   °· Only take over-the-counter or prescription medicine as directed by your health care provider. °· Keep all your postpartum appointments.   °· Let your health care provider know if you have any concerns.   °SEEK MEDICAL CARE IF: °You are having a reaction to or problems with your medicine. °SEEK IMMEDIATE MEDICAL CARE IF: °· You have suicidal feelings.   °· You think you may harm the baby or someone else. °MAKE SURE YOU: °· Understand these instructions. °· Will watch your condition. °· Will get help right away if you are not doing well or get worse. °  °This information is not intended to replace advice given to you by your health care provider. Make sure you discuss any questions you have with your health care provider. °  °Document Released: 09/29/2003 Document Revised: 12/30/2012 Document Reviewed: 10/06/2012 °Elsevier Interactive Patient Education ©2016 [employer] °Postpartum Care After Vaginal Delivery °After you deliver your newborn  (postpartum period), the usual stay in the hospital is 24-72 hours. If there were problems with your labor or delivery, or if you have other medical problems, you might be in the hospital longer.  °While you are in the hospital, you will receive help and instructions on how to care for yourself and your newborn during the postpartum period.  °While you are in the hospital: °· Be sure to tell your nurses if you have pain or discomfort, as well as   where you feel the pain and what makes the pain worse.  If you had an incision made near your vagina (episiotomy) or if you had some tearing during delivery, the nurses may put ice packs on your episiotomy or tear. The ice packs may help to reduce the pain and swelling.  If you are breastfeeding, you may feel uncomfortable contractions of your uterus for a couple of weeks. This is normal. The contractions help your uterus get back to normal size.  It is normal to have some bleeding after delivery.  For the first 1-3 days after delivery, the flow is red and the amount may be similar to a period.  It is common for the flow to start and stop.  In the first few days, you may pass some small clots. Let your nurses know if you begin to pass large clots or your flow increases.  Do not  flush blood clots down the toilet before having the nurse look at them.  During the next 3-10 days after delivery, your flow should become more watery and pink or brown-tinged in color.  Ten to fourteen days after delivery, your flow should be a small amount of yellowish-white discharge.  The amount of your flow will decrease over the first few weeks after delivery. Your flow may stop in 6-8 weeks. Most women have had their flow stop by 12 weeks after delivery.  You should change your sanitary pads frequently.  Wash your hands thoroughly with soap and water for at least 20 seconds after changing pads, using the toilet, or before holding or feeding your newborn.  You should  feel like you need to empty your bladder within the first 6-8 hours after delivery.  In case you become weak, lightheaded, or faint, call your nurse before you get out of bed for the first time and before you take a shower for the first time.  Within the first few days after delivery, your breasts may begin to feel tender and full. This is called engorgement. Breast tenderness usually goes away within 48-72 hours after engorgement occurs. You may also notice milk leaking from your breasts. If you are not breastfeeding, do not stimulate your breasts. Breast stimulation can make your breasts produce more milk.  Spending as much time as possible with your newborn is very important. During this time, you and your newborn can feel close and get to know each other. Having your newborn stay in your room (rooming in) will help to strengthen the bond with your newborn. It will give you time to get to know your newborn and become comfortable caring for your newborn.  Your hormones change after delivery. Sometimes the hormone changes can temporarily cause you to feel sad or tearful. These feelings should not last more than a few days. If these feelings last longer than that, you should talk to your caregiver.  If desired, talk to your caregiver about methods of family planning or contraception.  Talk to your caregiver about immunizations. Your caregiver may want you to have the following immunizations before leaving the hospital:  Tetanus, diphtheria, and pertussis (Tdap) or tetanus and diphtheria (Td) immunization. It is very important that you and your family (including grandparents) or others caring for your newborn are up-to-date with the Tdap or Td immunizations. The Tdap or Td immunization can help protect your newborn from getting ill.  Rubella immunization.  Varicella (chickenpox) immunization.  Influenza immunization. You should receive this annual immunization if you did not receive the  immunization during your pregnancy.   This information is not intended to replace advice given to you by your health care provider. Make sure you discuss any questions you have with your health care provider.   Document Released: 10/22/2006 Document Revised: 09/19/2011 Document Reviewed: 08/22/2011 Elsevier Interactive Patient Education [employer] Breastfeeding Deciding to breastfeed is one of the best choices you can make for you and your baby. A change in hormones during pregnancy causes your breast tissue to grow and increases the number and size of your milk ducts. These hormones also allow proteins, sugars, and fats from your blood supply to make breast milk in your milk-producing glands. Hormones prevent breast milk from being released before your baby is born as well as prompt milk flow after birth. Once breastfeeding has begun, thoughts of your baby, as well as his or her sucking or crying, can stimulate the release of milk from your milk-producing glands.  BENEFITS OF BREASTFEEDING For Your Baby  Your first milk (colostrum) helps your baby's digestive system function better.  There are antibodies in your milk that help your baby fight off infections.  Your baby has a lower incidence of asthma, allergies, and sudden infant death syndrome.  The nutrients in breast milk are better for your baby than infant formulas and are designed uniquely for your baby's needs.  Breast milk improves your baby's brain development.  Your baby is less likely to develop other conditions, such as childhood obesity, asthma, or type 2 diabetes mellitus. For You  Breastfeeding helps to create a very special bond between you and your baby.  Breastfeeding is convenient. Breast milk is always available at the correct temperature and costs nothing.  Breastfeeding helps to burn calories and helps you lose the weight gained during pregnancy.  Breastfeeding makes your uterus contract to its  prepregnancy size faster and slows bleeding (lochia) after you give birth.   Breastfeeding helps to lower your risk of developing type 2 diabetes mellitus, osteoporosis, and breast or ovarian cancer later in life. SIGNS THAT YOUR BABY IS HUNGRY Early Signs of Hunger  Increased alertness or activity.  Stretching.  Movement of the head from side to side.  Movement of the head and opening of the mouth when the corner of the mouth or cheek is stroked (rooting).  Increased sucking sounds, smacking lips, cooing, sighing, or squeaking.  Hand-to-mouth movements.  Increased sucking of fingers or hands. Late Signs of Hunger  Fussing.  Intermittent crying. Extreme Signs of Hunger Signs of extreme hunger will require calming and consoling before your baby will be able to breastfeed successfully. Do not wait for the following signs of extreme hunger to occur before you initiate breastfeeding:  Restlessness.  A loud, strong cry.  Screaming. BREASTFEEDING BASICS Breastfeeding Initiation  Find a comfortable place to sit or lie down, with your neck and back well supported.  Place a pillow or rolled up blanket under your baby to bring him or her to the level of your breast (if you are seated). Nursing pillows are specially designed to help support your arms and your baby while you breastfeed.  Make sure that your baby's abdomen is facing your abdomen.  Gently massage your breast. With your fingertips, massage from your chest wall toward your nipple in a circular motion. This encourages milk flow. You may need to continue this action during the feeding if your milk flows slowly.  Support your breast with 4 fingers underneath and your thumb above your nipple. Make sure  your fingers are well away from your nipple and your baby's mouth.  Stroke your baby's lips gently with your finger or nipple.  When your baby's mouth is open wide enough, quickly bring your baby to your breast, placing  your entire nipple and as much of the colored area around your nipple (areola) as possible into your baby's mouth.  More areola should be visible above your baby's upper lip than below the lower lip.  Your baby's tongue should be between his or her lower gum and your breast.  Ensure that your baby's mouth is correctly positioned around your nipple (latched). Your baby's lips should create a seal on your breast and be turned out (everted).  It is common for your baby to suck about 2-3 minutes in order to start the flow of breast milk. Latching Teaching your baby how to latch on to your breast properly is very important. An improper latch can cause nipple pain and decreased milk supply for you and poor weight gain in your baby. Also, if your baby is not latched onto your nipple properly, he or she may swallow some air during feeding. This can make your baby fussy. Burping your baby when you switch breasts during the feeding can help to get rid of the air. However, teaching your baby to latch on properly is still the best way to prevent fussiness from swallowing air while breastfeeding. Signs that your baby has successfully latched on to your nipple:  Silent tugging or silent sucking, without causing you pain.  Swallowing heard between every 3-4 sucks.  Muscle movement above and in front of his or her ears while sucking. Signs that your baby has not successfully latched on to nipple:  Sucking sounds or smacking sounds from your baby while breastfeeding.  Nipple pain. If you think your baby has not latched on correctly, slip your finger into the corner of your baby's mouth to break the suction and place it between your baby's gums. Attempt breastfeeding initiation again. Signs of Successful Breastfeeding Signs from your baby:  A gradual decrease in the number of sucks or complete cessation of sucking.  Falling asleep.  Relaxation of his or her body.  Retention of a small amount of milk  in his or her mouth.  Letting go of your breast by himself or herself. Signs from you:  Breasts that have increased in firmness, weight, and size 1-3 hours after feeding.  Breasts that are softer immediately after breastfeeding.  Increased milk volume, as well as a change in milk consistency and color by the fifth day of breastfeeding.  Nipples that are not sore, cracked, or bleeding. Signs That Your Pecola LeisureBaby is Getting Enough Milk  Wetting at least 3 diapers in a 24-hour period. The urine should be clear and pale yellow by age 30 days.  At least 3 stools in a 24-hour period by age 30 days. The stool should be soft and yellow.  At least 3 stools in a 24-hour period by age 65 days. The stool should be seedy and yellow.  No loss of weight greater than 10% of birth weight during the first 403 days of age.  Average weight gain of 4-7 ounces (113-198 g) per week after age 17 days.  Consistent daily weight gain by age 30 days, without weight loss after the age of 2 weeks. After a feeding, your baby may spit up a small amount. This is common. BREASTFEEDING FREQUENCY AND DURATION Frequent feeding will help you make more milk  and can prevent sore nipples and breast engorgement. Breastfeed when you feel the need to reduce the fullness of your breasts or when your baby shows signs of hunger. This is called "breastfeeding on demand." Avoid introducing a pacifier to your baby while you are working to establish breastfeeding (the first 4-6 weeks after your baby is born). After this time you may choose to use a pacifier. Research has shown that pacifier use during the first year of a baby's life decreases the risk of sudden infant death syndrome (SIDS). Allow your baby to feed on each breast as long as he or she wants. Breastfeed until your baby is finished feeding. When your baby unlatches or falls asleep while feeding from the first breast, offer the second breast. Because newborns are often sleepy in the first  few weeks of life, you may need to awaken your baby to get him or her to feed. Breastfeeding times will vary from baby to baby. However, the following rules can serve as a guide to help you ensure that your baby is properly fed:  Newborns (babies 94 weeks of age or younger) may breastfeed every 1-3 hours.  Newborns should not go longer than 3 hours during the day or 5 hours during the night without breastfeeding.  You should breastfeed your baby a minimum of 8 times in a 24-hour period until you begin to introduce solid foods to your baby at around 576 months of age. BREAST MILK PUMPING Pumping and storing breast milk allows you to ensure that your baby is exclusively fed your breast milk, even at times when you are unable to breastfeed. This is especially important if you are going back to work while you are still breastfeeding or when you are not able to be present during feedings. Your lactation consultant can give you guidelines on how long it is safe to store breast milk. A breast pump is a machine that allows you to pump milk from your breast into a sterile bottle. The pumped breast milk can then be stored in a refrigerator or freezer. Some breast pumps are operated by hand, while others use electricity. Ask your lactation consultant which type will work best for you. Breast pumps can be purchased, but some hospitals and breastfeeding support groups lease breast pumps on a monthly basis. A lactation consultant can teach you how to hand express breast milk, if you prefer not to use a pump. CARING FOR YOUR BREASTS WHILE YOU BREASTFEED Nipples can become dry, cracked, and sore while breastfeeding. The following recommendations can help keep your breasts moisturized and healthy:  Avoid using soap on your nipples.  Wear a supportive bra. Although not required, special nursing bras and tank tops are designed to allow access to your breasts for breastfeeding without taking off your entire bra or top.  Avoid wearing underwire-style bras or extremely tight bras.  Air dry your nipples for 3-104minutes after each feeding.  Use only cotton bra pads to absorb leaked breast milk. Leaking of breast milk between feedings is normal.  Use lanolin on your nipples after breastfeeding. Lanolin helps to maintain your skin's normal moisture barrier. If you use pure lanolin, you do not need to wash it off before feeding your baby again. Pure lanolin is not toxic to your baby. You may also hand express a few drops of breast milk and gently massage that milk into your nipples and allow the milk to air dry. In the first few weeks after giving birth, some women experience  extremely full breasts (engorgement). Engorgement can make your breasts feel heavy, warm, and tender to the touch. Engorgement peaks within 3-5 days after you give birth. The following recommendations can help ease engorgement:  Completely empty your breasts while breastfeeding or pumping. You may want to start by applying warm, moist heat (in the shower or with warm water-soaked hand towels) just before feeding or pumping. This increases circulation and helps the milk flow. If your baby does not completely empty your breasts while breastfeeding, pump any extra milk after he or she is finished.  Wear a snug bra (nursing or regular) or tank top for 1-2 days to signal your body to slightly decrease milk production.  Apply ice packs to your breasts, unless this is too uncomfortable for you.  Make sure that your baby is latched on and positioned properly while breastfeeding. If engorgement persists after 48 hours of following these recommendations, contact your health care provider or a Advertising copywriter. OVERALL HEALTH CARE RECOMMENDATIONS WHILE BREASTFEEDING  Eat healthy foods. Alternate between meals and snacks, eating 3 of each per day. Because what you eat affects your breast milk, some of the foods may make your baby more irritable than usual.  Avoid eating these foods if you are sure that they are negatively affecting your baby.  Drink milk, fruit juice, and water to satisfy your thirst (about 10 glasses a day).  Rest often, relax, and continue to take your prenatal vitamins to prevent fatigue, stress, and anemia.  Continue breast self-awareness checks.  Avoid chewing and smoking tobacco. Chemicals from cigarettes that pass into breast milk and exposure to secondhand smoke may harm your baby.  Avoid alcohol and drug use, including marijuana. Some medicines that may be harmful to your baby can pass through breast milk. It is important to ask your health care provider before taking any medicine, including all over-the-counter and prescription medicine as well as vitamin and herbal supplements. It is possible to become pregnant while breastfeeding. If birth control is desired, ask your health care provider about options that will be safe for your baby. SEEK MEDICAL CARE IF:  You feel like you want to stop breastfeeding or have become frustrated with breastfeeding.  You have painful breasts or nipples.  Your nipples are cracked or bleeding.  Your breasts are red, tender, or warm.  You have a swollen area on either breast.  You have a fever or chills.  You have nausea or vomiting.  You have drainage other than breast milk from your nipples.  Your breasts do not become full before feedings by the fifth day after you give birth.  You feel sad and depressed.  Your baby is too sleepy to eat well.  Your baby is having trouble sleeping.   Your baby is wetting less than 3 diapers in a 24-hour period.  Your baby has less than 3 stools in a 24-hour period.  Your baby's skin or the white part of his or her eyes becomes yellow.   Your baby is not gaining weight by 15 days of age. SEEK IMMEDIATE MEDICAL CARE IF:  Your baby is overly tired (lethargic) and does not want to wake up and feed.  Your baby develops an  unexplained fever.   This information is not intended to replace advice given to you by your health care provider. Make sure you discuss any questions you have with your health care provider.   Document Released: 12/25/2004 Document Revised: 09/15/2014 Document Reviewed: 06/18/2012 Elsevier Interactive Patient Education  2016 Elsevier Inc. ° °

## 2015-01-29 NOTE — Progress Notes (Signed)
Patient ID: Natalie [name], female   DOB: [birth year], 30 y.o.   MRN: [id number] Post Partum Day #2            Information for the patient's newborn:  Natalie [name], Heal [[id number]]  female   / circumcision done Feeding: bottle  Subjective: No HA, SOB, CP, F/C, breast symptoms. Pain well-managed with ibuprofen. Normal vaginal bleeding, no clots.      Objective:  Temp:  [98.1 F (36.7 C)-98.7 F (37.1 C)] 98.1 F (36.7 C) (01/21 0651) Pulse Rate:  [91-109] 97 (01/21 0651) Resp:  [19-20] 19 (01/21 0651) BP: (113-136)/(57-89) 121/57 mmHg (01/21 7829)   Intake/Output Summary (Last 24 hours) at 01/29/15 0828 Last data filed at 01/29/15 0200  Gross per 24 hour  Intake   3240 ml  Output   2455 ml  Net    785 ml       Recent Labs  01/27/15 1018 01/28/15 0740  WBC 6.0 9.9  HGB 11.3* 9.5*  HCT 35.0* 29.1*  PLT 159 136*    Blood type: O/Positive/-- (06/17 0000) Rubella: Immune (06/17 0000)    Physical Exam:  General: alert, cooperative, no distress and morbidly obese Uterine Fundus: firm Lochia: appropriate Perineum: Intact, no edema DVT Evaluation: No evidence of DVT seen on physical exam. Negative Homan's sign. No cords or calf tenderness. Calf/Ankle 3+ edema is present.    Assessment/Plan: PPD # 2 / 30 y.o., F6O1308 S/P: induced vaginal  Principal Problem:    Postpartum care following vaginal delivery (1/19)  Active Problems:    Hypertension affecting pregnancy    Class B1 gestational diabetes mellitus (GDM)    Acute blood loss anemia    Iron deficiency anemia of pregnancy with compounding ABL anemia    Thrombocytopenia (HCC)    Dependent edema   normal postpartum exam  Continue current postpartum care  Continue HCTZ 25 mg x 5 days / Metformin XR 750 mg BID WC / Prozac 10 mg daily / Niferex 150 mg daily / Kdur 20 mEq daily  D/C home  BP recheck with CMP lab work 1 week  PP visit with Wiliam Ke, CNM in 6 wks   LOS: 2 days   Raelyn Mora, M, MSN,  CNM 01/29/2015, 8:28 AM

## 2015-01-29 NOTE — Discharge Summary (Signed)
OBSTETRICAL DISCHARGE SUMMARY   Patient ID: Natalie [name] MRN: [id number] DOB/AGE: 30/30/1987 30 y.o.  Admit date: 01/27/2015 Admission Diagnoses: 38.6 weeks / class B DM (pre-existing) / chronic hypertension / morbid obesity    Discharge date:  01/29/2015 Discharge Diagnoses: S/P SVD / Class B DM / CHTN / Morbid Obesity  Reason for Admission: induction of labor  Prenatal history: F6O1308   EDC : 02/02/2015, by Ultrasound  Prenatal care at [facility]  Primary provider : Marlinda Mike, CNM Prenatal course complicated by class B DM (pre-existing) / chronic hypertension / morbid obesity  Prenatal Labs: ABO, Rh: O/Positive/-- (06/17 0000)  Antibody: Negative (06/17 0000) Rubella: Immune RPR: Non Reactive (01/19 1018)  HBsAg: Negative (06/17 0000)  HIV: Non-reactive (06/17 0000)  GBS: Negative (12/23 0000)   Labor Summary: Admitted for IOL / AROM / Pitocin / normal progression to complete dilation / SVD of viable female over intact perineum / no PP complications noted  Anesthesia: epidural Procedures: none Complications: none  Newborn Data:  Gender: female Feeding method : bottle Circumcision: done Weight: 8 lb 11.3 oz Apgar: 8/9  Discharge Information: Condition: stable Activity: pelvic rest Diet: routine Medications: PNV, Ibuprofen and HCTZ 25 mg x 5 days / Metformin XR 750 mg BID WC / Prozac 10 mg daily / Niferex 150 mg daily / Kdur 20 mEq daily   Instructions: [facility] / instructions reviewed Discharge to: home Follow up : [facility] at 6 weeks postpartum  Signed: Kenard Gower CNM, MSN 01/29/2015, 10:30 AM

## 2015-01-29 NOTE — Progress Notes (Signed)
Pt refused meds this morning and up until lunch time. Pt states that she would call out when she orders food, to let us know when to check blood sugar, but that she may not eat here today. Encouraged pt to order food, and she declined.

## 2015-01-29 NOTE — Lactation Note (Signed)
This note was copied from the chart of Natalie [name]. Lactation Consultation Note  Patient Name: Natalie [name] Date: 01/29/2015 Reason for consult: Initial assessment   With this mom - she initially breast fed her baby, and then has changed to formula, staing she was too tired to breastfeed. She denied having any questions for me at this time.   Maternal Data Formula Feeding for Exclusion: Yes (mom breast fed once at admission, and then switch to formula, saying she was too tired to breast feed) Has patient been taught Hand Expression?: No  Feeding Feeding Type: Formula Nipple Type: Slow - flow  LATCH Score/Interventions                      Lactation Tools Discussed/Used     Consult Status Consult Status: Complete    Alfred Levins 01/29/2015, 11:17 AM

## 2015-04-03 ENCOUNTER — Emergency Department (INDEPENDENT_AMBULATORY_CARE_PROVIDER_SITE_OTHER)
Admission: EM | Admit: 2015-04-03 | Discharge: 2015-04-03 | Disposition: A | Discharge status: Home / Self Care | Payer: 59 | Source: Home / Self Care | Attending: Emergency Medicine | Admitting: Emergency Medicine

## 2015-04-03 ENCOUNTER — Encounter (HOSPITAL_COMMUNITY): Payer: Self-pay | Admitting: Emergency Medicine

## 2015-04-03 ENCOUNTER — Other Ambulatory Visit (HOSPITAL_COMMUNITY)
Admission: RE | Admit: 2015-04-03 | Discharge: 2015-04-03 | Disposition: A | Discharge status: Home / Self Care | Payer: 59 | Source: Ambulatory Visit | Attending: Emergency Medicine | Admitting: Emergency Medicine

## 2015-04-03 DIAGNOSIS — L723 Sebaceous cyst: Secondary | ICD-10-CM | POA: Insufficient documentation

## 2015-04-03 DIAGNOSIS — L089 Local infection of the skin and subcutaneous tissue, unspecified: Secondary | ICD-10-CM

## 2015-04-03 MED ORDER — CLINDAMYCIN HCL 300 MG PO CAPS: 300.0000 mg | ORAL_CAPSULE | Freq: Three times a day (TID) | ORAL | Status: DC

## 2015-04-03 MED ORDER — LIDOCAINE-EPINEPHRINE (PF) 2 %-1:200000 IJ SOLN
INTRAMUSCULAR | Status: AC
  Filled 2015-04-03: qty 20

## 2015-04-03 NOTE — Discharge Instructions (Signed)
Excision of Skin Lesions, Care After Warm compresses, keep clean, shower. Allow to drain. The incision will close on its own in 3-4 days. Refer to this sheet in the next few weeks. These instructions provide you with information about caring for yourself after your procedure. Your health care provider may also give you more specific instructions. Your treatment has been planned according to current medical practices, but problems sometimes occur. Call your health care provider if you have any problems or questions after your procedure. WHAT TO EXPECT AFTER THE PROCEDURE After your procedure, it is common to have pain or discomfort at the excision site. HOME CARE INSTRUCTIONS  Take over-the-counter and prescription medicines only as told by your health care provider.  Follow instructions from your health care provider about:  How to take care of your excision site. You should keep the site clean, dry, and protected for at least 48 hours.  When and how you should change your bandage (dressing).  When you should remove your dressing.  Removing whatever was used to close your excision site.  Check the excision area every day for signs of infection. Watch for:  Redness, swelling, or pain.  Fluid, blood, or pus.  For bleeding, apply gentle but firm pressure to the area using a folded towel for 20 minutes.  Avoid high-impact exercise and activities until the stitches (sutures) are removed or the area heals.  Follow instructions from your health care provider about how to minimize scarring. Avoid sun exposure until the area has healed. Scarring should lessen over time.  Keep all follow-up visits as told by your health care provider. This is important. SEEK MEDICAL CARE IF:  You have a fever.  You have redness, swelling, or pain at the excision site.  You have fluid, blood, or pus coming from the excision site.  You have ongoing bleeding at the excision site.  You have pain that does  not improve in 2-3 days after your procedure.  You notice skin irregularities or changes in sensation.   This information is not intended to replace advice given to you by your health care provider. Make sure you discuss any questions you have with your health care provider.   Document Released: 05/11/2014 Document Reviewed: 05/11/2014 Elsevier Interactive Patient Education [employer]2016 Elsevier Inc.

## 2015-04-03 NOTE — ED Notes (Addendum)
Placed 4x4 and secured w/hypofix to right axilla.

## 2015-04-03 NOTE — ED Provider Notes (Signed)
CSN: [id number]     Arrival date & time 04/03/15  1308 History   First MD Initiated Contact with Patient 04/03/15 1456     Chief Complaint  Patient presents with  . Abscess   (Consider location/radiation/quality/duration/timing/severity/associated sxs/prior Treatment) HPI Comments: 30 year old female noticed a tender cyst in the right axilla approximately 3 days ago. It has increased in size. There is a soft fluid-filled cyst like structure overlying a slightly larger area of induration. Positive for tenderness. No erythema. No lymphangitis. Her axilla have been recently shaven.   Past Medical History  Diagnosis Date  . Depression   . Morbid obesity (HCC)   . Hx of varicella   . Gestational diabetes mellitus, delivered 11/28/2012  . Headache(784.0)   . Infection     UTI  . Gestational diabetes     metformin  . Pregnancy induced hypertension   . Gastroenteritis, acute 01/24/2015   Past Surgical History  Procedure Laterality Date  . No past surgeries     Family History  Problem Relation Age of Onset  . Hypothyroidism Mother   . Asthma Mother   . Bipolar disorder Mother   . Diabetes Mother   . Stroke Father   . Hypertension Father   . Seizures Father   . Cancer Father   . Asthma Sister   . Asthma Brother   . Asthma Daughter   . Diabetes Maternal Uncle   . Diabetes Maternal Grandmother   . Cancer Maternal Grandmother     breast  . Heart disease Paternal Grandfather   . Cancer Paternal Grandfather   . Heart attack Paternal Grandfather   . Cancer Maternal Aunt    Social History  Substance Use Topics  . Smoking status: Never Smoker   . Smokeless tobacco: Never Used  . Alcohol Use: No   OB History    Gravida Para Term Preterm AB TAB SAB Ectopic Multiple Living   4 4 4  0 0 0 0 0 0 4     Review of Systems  Constitutional: Negative.   HENT: Negative.   Respiratory: Negative.   Gastrointestinal: Negative.   Skin: Negative for rash and wound.       As per history  of present illness  Neurological: Negative.     Allergies  Review of patient's allergies indicates no known allergies.  Home Medications   Prior to Admission medications   Medication Sig Start Date End Date Taking? Authorizing Provider  clindamycin (CLEOCIN) 300 MG capsule Take 1 capsule (300 mg total) by mouth 3 (three) times daily. 04/03/15   Hayden Rasmussenavid Noha Karasik, NP  FLUoxetine (PROZAC) 10 MG capsule Take 10 mg by mouth daily.    Historical Provider, MD  hydrochlorothiazide (HYDRODIURIL) 25 MG tablet Take 1 tablet (25 mg total) by mouth daily. 01/29/15   Rolitta Arita Missawson, CNM  ibuprofen (ADVIL,MOTRIN) 600 MG tablet Take 1 tablet (600 mg total) by mouth every 6 (six) hours. 01/29/15   Raelyn Moraolitta Dawson, CNM  iron polysaccharides (NIFEREX) 150 MG capsule Take 1 capsule (150 mg total) by mouth daily. 01/29/15   Raelyn Moraolitta Dawson, CNM  metFORMIN (GLUCOPHAGE-XR) 750 MG 24 hr tablet Take 750 mg by mouth 2 (two) times daily.    Historical Provider, MD  potassium chloride SA (K-DUR,KLOR-CON) 20 MEQ tablet Take 1 tablet (20 mEq total) by mouth daily. 01/29/15   Raelyn Moraolitta Dawson, CNM   Meds Ordered and Administered this Visit  Medications - No data to display  BP 148/96 mmHg  Pulse 74  Temp(Src) 97.9  F (36.6 C) (Oral)  Resp 18  SpO2 100%  LMP 03/15/2015 No data found.   Physical Exam  Constitutional: She is oriented to person, place, and time. She appears well-developed and well-nourished. No distress.  Neck: Normal range of motion. Neck supple.  Cardiovascular: Normal rate, regular rhythm and normal heart sounds.   Pulmonary/Chest: Effort normal. No respiratory distress.  Neurological: She is alert and oriented to person, place, and time.  Skin: Skin is warm and dry.  As per history of present illness there is a 2 cm diameter raised cell structured thinwall fluctuant lesion well circumscribed and overlying an area of induration approximately 2 and half by 3 cm.  Psychiatric: She has a normal mood and  affect.  Nursing note and vitals reviewed.   ED Course  .Marland KitchenIncision and Drainage Date/Time: 04/03/2015 3:23 PM Performed by: Phineas Real, Aneth Schlagel Authorized by: Charm Rings Consent: Verbal consent obtained. Risks and benefits: risks, benefits and alternatives were discussed Consent given by: patient Patient understanding: patient states understanding of the procedure being performed Patient identity confirmed: verbally with patient Type: cyst Body area: upper extremity Location details: right arm Anesthesia: local infiltration Local anesthetic: lidocaine 2% with epinephrine Anesthetic total: 6 ml Patient sedated: no Scalpel size: 11 Incision type: single with marsupialization Incision depth: subcutaneous Complexity: simple Drainage: purulent and  bloody Drainage amount: moderate Wound treatment: wound left open Patient tolerance: Patient tolerated the procedure well with no immediate complications Comments: The balloon like structure overlying the induration was filled with purulent material and easily drained after puncture with the  #11 blade. The structure appeared to be cystic with relatively thick walls. The area of induration was further anesthetized with 2% with epi Xylocaine. Using a hemostat a small area of the induration was explored to break up loculations. No further purulence was expressed. After this small area was opened further exploration was performed. The cavernous area was very small and packing was not necessary.   (including critical care time)  Labs Review Labs Reviewed  CULTURE, ROUTINE-ABSCESS    Imaging Review No results found.   Visual Acuity Review  Right Eye Distance:   Left Eye Distance:   Bilateral Distance:    Right Eye Near:   Left Eye Near:    Bilateral Near:         MDM   1. Infected sebaceous cyst of skin     Warm compresses, keep clean, shower. Allow to drain. The incision will close on its own in 3-4 days. Clindamycin for 7  days. Use ABX in this case due to the amt of induration not marsupialized, likely too early and relatively shallow. Culture pending.  Hayden Rasmussen, NP 04/03/15 1532  Hayden Rasmussen, NP 04/03/15 2142

## 2015-04-03 NOTE — ED Notes (Signed)
C/o abscess under right axilla onset x3 days; pain is 3/10... Denies fevers and drainage A&O x4... No acute distress.

## 2015-04-05 ENCOUNTER — Telehealth (HOSPITAL_COMMUNITY): Payer: Self-pay | Admitting: *Deleted

## 2015-04-05 MED ORDER — DOXYCYCLINE HYCLATE 100 MG PO CAPS: 100.0000 mg | ORAL_CAPSULE | Freq: Two times a day (BID) | ORAL | Status: DC

## 2015-04-05 NOTE — ED Notes (Signed)
Patient reports GI symptoms with clindamycin prescription. Verbal order received to change to doxy 100mg  BID for 14 days Dr. Artis FlockKindl.

## 2015-04-06 LAB — CULTURE, ROUTINE-ABSCESS

## 2015-04-14 NOTE — ED Notes (Signed)
Called pt and notified of recent lab results from visit 3/26 Pt ID'd properly... Reports feeling better and sx have subsided and has finished antibiotics  Per Dr. Dayton ScrapeMurray,  Please let patient know that abscess drainage was negative for MRSA; grew a few proteus (gram neg organism) that is sensitive to all antibiotics tested (not tested for clindamycin sensitivity).  [facility]; as long as abscess is improving, no change in abx required. Finish clindamycin rx. Recheck for increasing redness/swelling/pain/drainage or new fever >100.5. LM   Adv pt if sx are not getting better to return  Pt verb understanding

## 2015-04-29 ENCOUNTER — Emergency Department (HOSPITAL_COMMUNITY)
Admission: EM | Admit: 2015-04-29 | Discharge: 2015-04-29 | Disposition: A | Discharge status: Home / Self Care | Payer: 59 | Attending: Dermatology | Admitting: Dermatology

## 2015-04-29 ENCOUNTER — Encounter (HOSPITAL_COMMUNITY): Payer: Self-pay

## 2015-04-29 DIAGNOSIS — Z791 Long term (current) use of non-steroidal anti-inflammatories (NSAID): Secondary | ICD-10-CM | POA: Diagnosis not present

## 2015-04-29 DIAGNOSIS — Z5321 Procedure and treatment not carried out due to patient leaving prior to being seen by health care provider: Secondary | ICD-10-CM | POA: Insufficient documentation

## 2015-04-29 DIAGNOSIS — Z79899 Other long term (current) drug therapy: Secondary | ICD-10-CM | POA: Insufficient documentation

## 2015-04-29 DIAGNOSIS — R51 Headache: Secondary | ICD-10-CM | POA: Insufficient documentation

## 2015-04-29 DIAGNOSIS — R42 Dizziness and giddiness: Secondary | ICD-10-CM | POA: Insufficient documentation

## 2015-04-29 DIAGNOSIS — F329 Major depressive disorder, single episode, unspecified: Secondary | ICD-10-CM | POA: Insufficient documentation

## 2015-04-29 LAB — CBC WITH DIFFERENTIAL/PLATELET
Basophils Absolute: 0 10*3/uL (ref 0.0–0.1)
Basophils Relative: 0 %
EOS PCT: 3 %
Eosinophils Absolute: 0.1 10*3/uL (ref 0.0–0.7)
HEMATOCRIT: 40.4 % (ref 36.0–46.0)
HEMOGLOBIN: 13 g/dL (ref 12.0–15.0)
LYMPHS ABS: 2.3 10*3/uL (ref 0.7–4.0)
LYMPHS PCT: 47 %
MCH: 25.7 pg — AB (ref 26.0–34.0)
MCHC: 32.2 g/dL (ref 30.0–36.0)
MCV: 79.8 fL (ref 78.0–100.0)
Monocytes Absolute: 0.4 10*3/uL (ref 0.1–1.0)
Monocytes Relative: 9 %
Neutro Abs: 2 10*3/uL (ref 1.7–7.7)
Neutrophils Relative %: 41 %
PLATELETS: 224 10*3/uL (ref 150–400)
RBC: 5.06 MIL/uL (ref 3.87–5.11)
RDW: 15.8 % — ABNORMAL HIGH (ref 11.5–15.5)
WBC: 4.8 10*3/uL (ref 4.0–10.5)

## 2015-04-29 LAB — BASIC METABOLIC PANEL
Anion gap: 10 (ref 5–15)
BUN: 16 mg/dL (ref 6–20)
CHLORIDE: 106 mmol/L (ref 101–111)
CO2: 23 mmol/L (ref 22–32)
Calcium: 9.7 mg/dL (ref 8.9–10.3)
Creatinine, Ser: 0.76 mg/dL (ref 0.44–1.00)
GFR calc Af Amer: >60 mL/min (ref >60)
GFR calc non Af Amer: >60 mL/min (ref >60)
GLUCOSE: 102 mg/dL — AB (ref 65–99)
POTASSIUM: 4.1 mmol/L (ref 3.5–5.1)
Sodium: 139 mmol/L (ref 135–145)

## 2015-04-29 LAB — URINE MICROSCOPIC-ADD ON

## 2015-04-29 LAB — URINALYSIS, ROUTINE W REFLEX MICROSCOPIC
Bilirubin Urine: NEGATIVE
GLUCOSE, UA: NEGATIVE mg/dL
Ketones, ur: NEGATIVE mg/dL
Nitrite: NEGATIVE
PH: 6 (ref 5.0–8.0)
Specific Gravity, Urine: 1.02 (ref 1.005–1.030)

## 2015-04-29 LAB — PREGNANCY, URINE: Preg Test, Ur: NEGATIVE

## 2015-04-29 LAB — CBG MONITORING, ED: Glucose-Capillary: 96 mg/dL (ref 65–99)

## 2015-04-29 NOTE — ED Notes (Addendum)
Pt reports headache x 3 days and c/o dizziness that started this morning at 7am.  Denies any visual changes, weakness or numbness in extremities, and denies any difficulty speaking or swallowing.   Pt says dizziness comes when she goes from sitting to standing and then subsides after a few minutes.

## 2015-04-29 NOTE — ED Notes (Signed)
Patient states she has to leave to pick up daughter at [id number], Unable to stay for evaluation

## 2016-09-25 LAB — OB RESULTS CONSOLE RPR: RPR: NONREACTIVE

## 2016-09-25 LAB — OB RESULTS CONSOLE RUBELLA ANTIBODY, IGM: Rubella: IMMUNE

## 2016-09-26 LAB — OB RESULTS CONSOLE HIV ANTIBODY (ROUTINE TESTING): HIV: NONREACTIVE

## 2016-09-26 LAB — OB RESULTS CONSOLE HEPATITIS B SURFACE ANTIGEN: Hepatitis B Surface Ag: NEGATIVE

## 2016-10-03 LAB — OB RESULTS CONSOLE GC/CHLAMYDIA
CHLAMYDIA, DNA PROBE: NEGATIVE
GC PROBE AMP, GENITAL: NEGATIVE

## 2017-01-08 NOTE — L&D Delivery Note (Signed)
Delivery Note  FHT cat 1 and 2 in 2nd stage Patient encouraged to push at 0 station, fetal head w/ poor flexion and pendulous abdomen impeding engagement. Able to rotate and engage fetal head with pushing effort and repositioning laterally.  At 5:38 PM a viable female was delivered via Vaginal, Spontaneous (Presentation: OA to LOT, rapid descent ).  APGAR: 8, 9; weight  pending.   Placenta status: S/C/I, gentle cord traction and LUS massage.  Cord:  with the following complications: tortuous veins.  Cord blood collected for typing.  Anesthesia:  none Episiotomy: None Lacerations: None Est. Blood Loss (mL): 200  Mom to postpartum.  Baby "Carly" to Couplet care / Skin to Skin.  Neta MendsDaniela C Paul, CNM 04/19/2017, 6:05 PM

## 2017-04-03 LAB — OB RESULTS CONSOLE GBS: STREP GROUP B AG: NEGATIVE

## 2017-04-19 ENCOUNTER — Inpatient Hospital Stay (HOSPITAL_COMMUNITY)
Admission: AD | Admit: 2017-04-19 | Discharge: 2017-04-21 | DRG: 805 | Disposition: A | Discharge status: Home / Self Care | Payer: 59 | Source: Ambulatory Visit

## 2017-04-19 ENCOUNTER — Other Ambulatory Visit: Payer: Self-pay

## 2017-04-19 ENCOUNTER — Encounter (HOSPITAL_COMMUNITY): Payer: Self-pay | Admitting: *Deleted

## 2017-04-19 DIAGNOSIS — E119 Type 2 diabetes mellitus without complications: Secondary | ICD-10-CM | POA: Diagnosis present

## 2017-04-19 DIAGNOSIS — O1205 Gestational edema, complicating the puerperium: Secondary | ICD-10-CM | POA: Diagnosis present

## 2017-04-19 DIAGNOSIS — O24119 Pre-existing diabetes mellitus, type 2, in pregnancy, unspecified trimester: Secondary | ICD-10-CM | POA: Diagnosis present

## 2017-04-19 DIAGNOSIS — O99214 Obesity complicating childbirth: Secondary | ICD-10-CM | POA: Diagnosis present

## 2017-04-19 DIAGNOSIS — O2412 Pre-existing diabetes mellitus, type 2, in childbirth: Secondary | ICD-10-CM | POA: Diagnosis present

## 2017-04-19 DIAGNOSIS — Z3A37 37 weeks gestation of pregnancy: Secondary | ICD-10-CM | POA: Diagnosis not present

## 2017-04-19 DIAGNOSIS — O99344 Other mental disorders complicating childbirth: Secondary | ICD-10-CM | POA: Diagnosis present

## 2017-04-19 DIAGNOSIS — Z7984 Long term (current) use of oral hypoglycemic drugs: Secondary | ICD-10-CM | POA: Diagnosis not present

## 2017-04-19 DIAGNOSIS — O10919 Unspecified pre-existing hypertension complicating pregnancy, unspecified trimester: Secondary | ICD-10-CM | POA: Diagnosis present

## 2017-04-19 DIAGNOSIS — R609 Edema, unspecified: Secondary | ICD-10-CM | POA: Diagnosis present

## 2017-04-19 DIAGNOSIS — O1002 Pre-existing essential hypertension complicating childbirth: Principal | ICD-10-CM | POA: Diagnosis present

## 2017-04-19 DIAGNOSIS — F329 Major depressive disorder, single episode, unspecified: Secondary | ICD-10-CM | POA: Diagnosis present

## 2017-04-19 LAB — COMPREHENSIVE METABOLIC PANEL
ALBUMIN: 2.8 g/dL — AB (ref 3.5–5.0)
ALT: 11 U/L — ABNORMAL LOW (ref 14–54)
ANION GAP: 8 (ref 5–15)
AST: 22 U/L (ref 15–41)
Alkaline Phosphatase: 115 U/L (ref 38–126)
BILIRUBIN TOTAL: 0.5 mg/dL (ref 0.3–1.2)
BUN: <5 mg/dL — ABNORMAL LOW (ref 6–20)
CALCIUM: 8.8 mg/dL — AB (ref 8.9–10.3)
CO2: 17 mmol/L — ABNORMAL LOW (ref 22–32)
Chloride: 113 mmol/L — ABNORMAL HIGH (ref 101–111)
Creatinine, Ser: 0.53 mg/dL (ref 0.44–1.00)
GFR calc non Af Amer: >60 mL/min (ref >60)
GLUCOSE: 104 mg/dL — AB (ref 65–99)
Potassium: 3.5 mmol/L (ref 3.5–5.1)
Sodium: 138 mmol/L (ref 135–145)
TOTAL PROTEIN: 6.5 g/dL (ref 6.5–8.1)

## 2017-04-19 LAB — CBC
HEMATOCRIT: 34.8 % — AB (ref 36.0–46.0)
Hemoglobin: 11.7 g/dL — ABNORMAL LOW (ref 12.0–15.0)
MCH: 27.7 pg (ref 26.0–34.0)
MCHC: 33.6 g/dL (ref 30.0–36.0)
MCV: 82.3 fL (ref 78.0–100.0)
Platelets: 148 10*3/uL — ABNORMAL LOW (ref 150–400)
RBC: 4.23 MIL/uL (ref 3.87–5.11)
RDW: 16.3 % — ABNORMAL HIGH (ref 11.5–15.5)
WBC: 7.4 10*3/uL (ref 4.0–10.5)

## 2017-04-19 LAB — TYPE AND SCREEN
ABO/RH(D): O POS
ANTIBODY SCREEN: NEGATIVE

## 2017-04-19 LAB — GLUCOSE, CAPILLARY: GLUCOSE-CAPILLARY: 93 mg/dL (ref 65–99)

## 2017-04-19 MED ORDER — ONDANSETRON HCL 4 MG PO TABS: 4.0000 mg | ORAL_TABLET | ORAL | Status: DC | PRN

## 2017-04-19 MED ORDER — BUPROPION HCL ER (XL) 150 MG PO TB24
150.0000 mg | ORAL_TABLET | Freq: Every day | ORAL | Status: DC
  Administered 2017-04-19 – 2017-04-21 (×3): 150 mg via ORAL
  Filled 2017-04-19 (×4): qty 1

## 2017-04-19 MED ORDER — OXYCODONE-ACETAMINOPHEN 5-325 MG PO TABS: 2.0000 | ORAL_TABLET | ORAL | Status: DC | PRN

## 2017-04-19 MED ORDER — BISACODYL 10 MG RE SUPP: 10.0000 mg | Freq: Every day | RECTAL | Status: DC | PRN

## 2017-04-19 MED ORDER — FLEET ENEMA 7-19 GM/118ML RE ENEM: 1.0000 | ENEMA | Freq: Every day | RECTAL | Status: DC | PRN

## 2017-04-19 MED ORDER — TETANUS-DIPHTH-ACELL PERTUSSIS 5-2.5-18.5 LF-MCG/0.5 IM SUSP: 0.5000 mL | Freq: Once | INTRAMUSCULAR | Status: DC

## 2017-04-19 MED ORDER — ONDANSETRON HCL 4 MG/2ML IJ SOLN: 4.0000 mg | INTRAMUSCULAR | Status: DC | PRN

## 2017-04-19 MED ORDER — OXYTOCIN 40 UNITS IN LACTATED RINGERS INFUSION - SIMPLE MED
1.0000 m[IU]/min | INTRAVENOUS | Status: DC
  Administered 2017-04-19: 1 m[IU]/min via INTRAVENOUS

## 2017-04-19 MED ORDER — SIMETHICONE 80 MG PO CHEW: 80.0000 mg | CHEWABLE_TABLET | ORAL | Status: DC | PRN

## 2017-04-19 MED ORDER — SOD CITRATE-CITRIC ACID 500-334 MG/5ML PO SOLN
30.0000 mL | ORAL | Status: DC | PRN
  Filled 2017-04-19: qty 15

## 2017-04-19 MED ORDER — DIPHENHYDRAMINE HCL 25 MG PO CAPS: 25.0000 mg | ORAL_CAPSULE | Freq: Four times a day (QID) | ORAL | Status: DC | PRN

## 2017-04-19 MED ORDER — METFORMIN HCL ER 750 MG PO TB24
750.0000 mg | ORAL_TABLET | Freq: Every day | ORAL | Status: DC
  Administered 2017-04-20: 750 mg via ORAL
  Filled 2017-04-19 (×3): qty 1

## 2017-04-19 MED ORDER — ONDANSETRON HCL 4 MG/2ML IJ SOLN: 4.0000 mg | Freq: Four times a day (QID) | INTRAMUSCULAR | Status: DC | PRN

## 2017-04-19 MED ORDER — OXYTOCIN 40 UNITS IN LACTATED RINGERS INFUSION - SIMPLE MED
2.5000 [IU]/h | INTRAVENOUS | Status: DC
  Filled 2017-04-19: qty 1000

## 2017-04-19 MED ORDER — ZOLPIDEM TARTRATE 5 MG PO TABS: 5.0000 mg | ORAL_TABLET | Freq: Every evening | ORAL | Status: DC | PRN

## 2017-04-19 MED ORDER — COCONUT OIL OIL: 1.0000 "application " | TOPICAL_OIL | Status: DC | PRN

## 2017-04-19 MED ORDER — SENNOSIDES-DOCUSATE SODIUM 8.6-50 MG PO TABS
2.0000 | ORAL_TABLET | ORAL | Status: DC
  Administered 2017-04-19 – 2017-04-20 (×2): 2 via ORAL
  Filled 2017-04-19 (×2): qty 2

## 2017-04-19 MED ORDER — BENZOCAINE-MENTHOL 20-0.5 % EX AERO: 1.0000 "application " | INHALATION_SPRAY | CUTANEOUS | Status: DC | PRN

## 2017-04-19 MED ORDER — IBUPROFEN 600 MG PO TABS
600.0000 mg | ORAL_TABLET | Freq: Four times a day (QID) | ORAL | Status: DC
  Administered 2017-04-19 – 2017-04-21 (×8): 600 mg via ORAL
  Filled 2017-04-19 (×8): qty 1

## 2017-04-19 MED ORDER — LIDOCAINE HCL (PF) 1 % IJ SOLN
30.0000 mL | INTRAMUSCULAR | Status: DC | PRN
  Filled 2017-04-19: qty 30

## 2017-04-19 MED ORDER — ACETAMINOPHEN 325 MG PO TABS
650.0000 mg | ORAL_TABLET | ORAL | Status: DC | PRN
  Administered 2017-04-20: 650 mg via ORAL
  Filled 2017-04-19: qty 2

## 2017-04-19 MED ORDER — ACETAMINOPHEN 325 MG PO TABS: 650.0000 mg | ORAL_TABLET | ORAL | Status: DC | PRN

## 2017-04-19 MED ORDER — WITCH HAZEL-GLYCERIN EX PADS: 1.0000 "application " | MEDICATED_PAD | CUTANEOUS | Status: DC | PRN

## 2017-04-19 MED ORDER — DIBUCAINE 1 % RE OINT: 1.0000 "application " | TOPICAL_OINTMENT | RECTAL | Status: DC | PRN

## 2017-04-19 MED ORDER — LACTATED RINGERS IV SOLN
500.0000 mL | INTRAVENOUS | Status: DC | PRN
  Administered 2017-04-19 (×2): 1000 mL via INTRAVENOUS

## 2017-04-19 MED ORDER — PRENATAL MULTIVITAMIN CH
1.0000 | ORAL_TABLET | Freq: Every day | ORAL | Status: DC
  Administered 2017-04-20 – 2017-04-21 (×2): 1 via ORAL
  Filled 2017-04-19 (×2): qty 1

## 2017-04-19 MED ORDER — OXYCODONE-ACETAMINOPHEN 5-325 MG PO TABS: 1.0000 | ORAL_TABLET | ORAL | Status: DC | PRN

## 2017-04-19 MED ORDER — LACTATED RINGERS IV SOLN: INTRAVENOUS | Status: DC

## 2017-04-19 MED ORDER — OXYTOCIN BOLUS FROM INFUSION
500.0000 mL | Freq: Once | INTRAVENOUS | Status: AC
  Administered 2017-04-19: 500 mL via INTRAVENOUS

## 2017-04-19 MED ORDER — TERBUTALINE SULFATE 1 MG/ML IJ SOLN
0.2500 mg | Freq: Once | INTRAMUSCULAR | Status: DC | PRN
  Filled 2017-04-19: qty 1

## 2017-04-19 MED ORDER — OXYTOCIN 10 UNIT/ML IJ SOLN: 10.0000 [IU] | Freq: Once | INTRAMUSCULAR | Status: DC

## 2017-04-19 NOTE — Progress Notes (Signed)
EFM applied @ 1158, variable decel audible as soon as EFM applies.  Pt repositioned into left lateral position.  Pt coitinued to have variable decels & prolonged decels @ 1216.  Pt repositioned into semi fowlers. RN requested CNM be notified secondary fetal heart status.  Pt repositioned into right & left lateral positions d/t decels.

## 2017-04-19 NOTE — MAU Note (Signed)
Pt reports she was coming to be seen for ctxs, but water broke en route @ 1134.  Reports clear fluid.  Reports ctxs were every 3 minutes but have since spaced out.  Denies VB, reports +FM.

## 2017-04-19 NOTE — Progress Notes (Signed)
S: COping well w/ ctx, using various positions - side lying an H/K on bed to help rotation Requesting pain meds  O: VSS, tachy  FHR 150, min/mod variability, no accels, no decels IUPC variable intensity, normal BL.   SVE 7-8/80/high  A/P; Latent labor, FHT cat 1  Pitocin augmentation Nitrous Oxide for comfort Dr. Ernestina PennaFogleman in consult  Natalie Mendsaniela C Paul, Natalie [name], Natalie [name] 04/19/2017, 3:41 PM

## 2017-04-19 NOTE — H&P (Signed)
OB ADMISSION/ HISTORY & PHYSICAL:  Admission Date: 04/19/2017 11:49 AM  Admit Diagnosis: term pregnancy, in labor, DM2, cHTN   Natalie [name] is a 32 y.o. female presenting for labor ctx and ROM at early term. Ctx started this morning at 0730, was on her way to office for exam when her amniotic bag ruptured with clear fluid. Noted clear fluid. Instructed to proceed to hospital.  No HA/NV/RUQ pain. No VB.  Reports did not take metformin today or yesterday.  .  Prenatal History: [id number]   EDC : 05/07/2017, Date entered prior to episode creation  Prenatal care at [facility] since [redacted] weeks gestation, CNM/MD co management.  Prenatal course complicated by DM 2 on metformin, suboptimal control, cHTN stable off meds, morbid obesity - BMI 49, depression stable on Wellbutrin.  Prenatal Labs: ABO, Rh:   O pos Antibody: negative Rubella:   immune RPR:   NR HBsAg:   neg HIV:   neg GBS:   neg A1c 6.5 03/18/2017 (was 5.4 on 11/2016)  Anatomy sono normal female, AGA, normal fetal echo ANFT NST/BPP started 32 wks reassuring, last EFW at 36 wks AGA, 6'7", AFI 22 PEC w/u labs essentially wnl except elevated proteinuria, 500 mg at 35 wks  Medical / Surgical History :  Past medical history:  Past Medical History:  Diagnosis Date  . Depression   . Gastroenteritis, acute 01/24/2015  . Gestational diabetes    metformin  . Gestational diabetes mellitus, delivered 11/28/2012  . Headache(784.0)   . Hx of varicella   . Infection    UTI  . Morbid obesity (HCC)   . Pregnancy induced hypertension      Past surgical history:  Past Surgical History:  Procedure Laterality Date  . NO PAST SURGERIES       Family History:  Family History  Problem Relation Age of Onset  . Hypothyroidism Mother   . Asthma Mother   . Bipolar disorder Mother   . Diabetes Mother   . Stroke Father   . Hypertension Father   . Seizures Father   . Cancer Father   . Asthma Sister   . Asthma Brother    . Asthma Daughter   . Diabetes Maternal Uncle   . Diabetes Maternal Grandmother   . Cancer Maternal Grandmother        breast  . Heart disease Paternal Grandfather   . Cancer Paternal Grandfather   . Heart attack Paternal Grandfather   . Cancer Maternal Aunt      Social History:  reports that she has never smoked. She has never used smokeless tobacco. She reports that she does not drink alcohol or use drugs.   Allergies: Patient has no known allergies.    Current Medications at time of admission:  Wellbutrin, Metformin, PNV    Review of Systems: ROS As noted above    Physical Exam:  Dilation: 4 Effacement (%): 80 Station: -3 Exam by:: D. Paull CNM Vitals:   04/19/17 1340 04/19/17 1355  BP: 134/80   Pulse: (!) 127   Resp:  18  Temp:  98.2 F (36.8 C)  SpO2:       General: AAO x 3, NAD Heart: RRR Lungs: CTAB Abdomen: Gravid, NT, obese Extremities: + 2 edema Genitalia : no lesions, clear AF FHR: 160, minimal variability, variable and late decels but difficulty assessing d/t body habitus TOCO: ctx q 3-5 min, moderate to palp  Labs:    Recent Labs    04/19/17 1305  WBC 7.4  HGB 11.7*  HCT 34.8*  PLT 148*       Assessment/Plan:  32 y.o. G5P4004 at 7140w3d, DM 2,   Labor, SROM  -latent labor, hx of IOL x 4 w/ rapid transition and 2nd stage  FHT Category 2  - placed IUPC/FSE for better evaluation, noted repetitive late decels and minimal variability  - Fluids infusing bolus for intrauterine resuscitation  - likely placental insufficiency  DM2 - random glucose 90's  - suspect suboptimal control d/t increased A1c in 3rd trimester and poor compliance w/ med management  - stable glucose level, rpts CBG  q 2 hrs if continuing labor  cHTN  - stable off meds, normotensive  - PEC w/o wnl, increased proteinuria  - no neral s/sx at present  Given category 2 FHT and remote from delivery, will work towards intrauterine resuscitation Dr Ernestina PennaFogleman  notified of admission and findings, to consult.   Neta Mendsaniela C Kylia Grajales CNM, MSN 04/19/2017, 2:27 PM

## 2017-04-19 NOTE — Progress Notes (Signed)
Pitocin ordered by D. Paull CNM. Colon Flattery. Paul CNM consulted with Amy Schrinjar Director and T. Willis AD along with Dr. Ernestina PennaFogleman. Decision made to start Pitocin and increase slowly.  Dr. Ernestina PennaFogleman will remain in house.

## 2017-04-19 NOTE — Progress Notes (Signed)
S: Coping well w ctx, [facility] and [facility].   O: VSSAF, mild tachy, anxious FHR 150, late decels resolved in past hour after fluids bolus, min-mod variability, no accels Ctx q 1.5-3 min, 50-80 mmHg  SVE 7/80/high  A/P: Active labor FHT improved, cat 1-2  Discussed w/ patient option for continued expectant management vs cesarean section; even though FHT improved cannot guarantee will continue same through transition and second stage and have concern for fetal outcome. Given prior vaginal births x 4 and hx of rapid active labor continued expectant management can be considered at this time.  Risks and benefits of C/S also reviewed, safer for baby, increased risk to mother - risk of bowel and surrounding organs injury, hemorrhage, infection at incision site.  After discussion, patient chooses to continue labor Low threshold for urgent C/S if FHT deteriorates  Dr. Ernestina PennaFogleman present and agrees to POC  Neta Mendsaniela C Satine Hausner, MSN, CNM 04/19/2017, 3:03 PM

## 2017-04-20 LAB — COMPREHENSIVE METABOLIC PANEL
ALT: 16 U/L (ref 14–54)
AST: 44 U/L — AB (ref 15–41)
Albumin: 2.7 g/dL — ABNORMAL LOW (ref 3.5–5.0)
Alkaline Phosphatase: 101 U/L (ref 38–126)
Anion gap: 6 (ref 5–15)
BILIRUBIN TOTAL: 0.4 mg/dL (ref 0.3–1.2)
CALCIUM: 9 mg/dL (ref 8.9–10.3)
CHLORIDE: 112 mmol/L — AB (ref 101–111)
CO2: 20 mmol/L — ABNORMAL LOW (ref 22–32)
CREATININE: 0.56 mg/dL (ref 0.44–1.00)
GFR calc Af Amer: >60 mL/min (ref >60)
Glucose, Bld: 121 mg/dL — ABNORMAL HIGH (ref 65–99)
Potassium: 3.5 mmol/L (ref 3.5–5.1)
Sodium: 138 mmol/L (ref 135–145)
TOTAL PROTEIN: 6.3 g/dL — AB (ref 6.5–8.1)

## 2017-04-20 LAB — GLUCOSE, RANDOM: Glucose, Bld: 118 mg/dL — ABNORMAL HIGH (ref 65–99)

## 2017-04-20 LAB — CBC
HEMATOCRIT: 30.7 % — AB (ref 36.0–46.0)
HEMOGLOBIN: 10.1 g/dL — AB (ref 12.0–15.0)
MCH: 26.9 pg (ref 26.0–34.0)
MCHC: 32.9 g/dL (ref 30.0–36.0)
MCV: 81.9 fL (ref 78.0–100.0)
Platelets: 135 10*3/uL — ABNORMAL LOW (ref 150–400)
RBC: 3.75 MIL/uL — AB (ref 3.87–5.11)
RDW: 16.3 % — ABNORMAL HIGH (ref 11.5–15.5)
WBC: 12 10*3/uL — ABNORMAL HIGH (ref 4.0–10.5)

## 2017-04-20 LAB — RPR: RPR Ser Ql: NONREACTIVE

## 2017-04-20 MED ORDER — HYDROCHLOROTHIAZIDE 25 MG PO TABS
25.0000 mg | ORAL_TABLET | Freq: Every day | ORAL | Status: DC
  Administered 2017-04-20 – 2017-04-21 (×2): 25 mg via ORAL
  Filled 2017-04-20 (×3): qty 1

## 2017-04-20 NOTE — Progress Notes (Addendum)
PPD#1 SVD Girl "Carli" S:  Reports feeling good             Tolerating po/ No nausea or vomiting             Bleeding is moderate             Pain controlled with PO meds             Up ad lib / ambulatory / voiding w/o difficulty  Newborn Bottle   O:   VS: BP (!) 145/83 (BP Location: Left Arm)   Pulse 93   Temp 98 F (36.7 C)   Resp 16   Ht 5\' 4"  (1.626 m)   Wt 130.2 kg (287 lb)   SpO2 99%   Breastfeeding? Unknown   BMI 49.26 kg/m    LABS:              Recent Labs    04/19/17 1305 04/20/17 0538  WBC 7.4 12.0*  HGB 11.7* 10.1*  PLT 148* 135*               Blood type: --/--/O POS (04/12 1305)  Rubella: Immune (09/18 0000)                    I&O: Intake/Output      04/12 0701 - 04/13 0700 04/13 0701 - 04/14 0700   Blood 408    Total Output 408    Net -408                       Physical Exam:             General: AAO x3  Abdomen: soft, non-tender, non-distended              Fundus: firm, non-tender, U-1  Perineum: Intact  Lochia: appropriate, no clots  Extremities: 2+edema BLE, no calf pain, cords, or tenderness    A:  1.32 yo G5 P5005 PP Day #1 2. Type II DM- poor adherence to oral meds during pregnancy 3. Chronic HTN- stable off meds during pregnancy 4. Dependent edema 5. Depression-managed on Wellbutrin 150 mg and effective during preg  P:  1. Routine PP orders 2. Continue Metformin 750 mg PO daily 3. Monitor BP per protocol, report SBP>160, DBP >90     -CMP today 4. Start HCTZ 25mg  PO today 5. Agrees to resume Wellbutrin 150mg  PO daily 6. Anticipate D/C on 4/14  Rhea PinkVivian Teofilo Lupinacci, [facility] 04/20/17

## 2017-04-20 NOTE — Progress Notes (Signed)
[name] was referred for history of depression and anxiety. Referral screened out by CSW because per chart review, patient's depression is being controlled with Wellbutrin. Patient scored a nine on her EDPS. CSW spoke with Brooke Joyce, RN regarding patient. CSW informed RN of referral being screened out.   Please contact CSW if mother of baby requests, if needs arise, or if mother of baby scores greater than a nine or answers yes to question ten on [place] Postpartum Depression Screen.   Natalie [name], MSW, LCSW-A Clinical Social Worker [facility] [phone]     

## 2017-04-21 LAB — GLUCOSE, RANDOM: GLUCOSE: 99 mg/dL (ref 65–99)

## 2017-04-21 MED ORDER — HYDROCHLOROTHIAZIDE 25 MG PO TABS: 25.0000 mg | ORAL_TABLET | Freq: Every day | ORAL | 0 refills | Status: DC

## 2017-04-21 MED ORDER — BENZOCAINE-MENTHOL 20-0.5 % EX AERO: 1.0000 "application " | INHALATION_SPRAY | CUTANEOUS | Status: DC | PRN

## 2017-04-21 MED ORDER — ACETAMINOPHEN 325 MG PO TABS: 650.0000 mg | ORAL_TABLET | ORAL | Status: DC | PRN

## 2017-04-21 MED ORDER — IBUPROFEN 600 MG PO TABS: 600.0000 mg | ORAL_TABLET | Freq: Four times a day (QID) | ORAL | 0 refills | Status: DC

## 2017-04-21 NOTE — Discharge Summary (Addendum)
Obstetric Discharge Summary Reason for Admission: rupture of membranes, DM2, cHTN, BMI 48 Prenatal Procedures: ultrasound, fetal echo, BPP, NST Intrapartum Procedures: spontaneous vaginal delivery Postpartum Procedures: none Complications-Operative and Postpartum: none Hemoglobin  Date Value Ref Range Status  04/20/2017 10.1 (L) 12.0 - 15.0 g/dL Final   HCT  Date Value Ref Range Status  04/20/2017 30.7 (L) 36.0 - 46.0 % Final    Physical Exam:  General: alert, cooperative and no distress Lochia: appropriate Uterine Fundus: firm  Perineum: Intact DVT Evaluation: Dependent edema, no pain, cords, or calf tenderness.  Discharge Diagnoses: Term Pregnancy-delivered and Chronic hypertension in pregnancy, Pre-existing diabetes mellitus during pregnancy, Dependent edema  Discharge Information: Date: 04/21/2017 Activity: pelvic rest  Diet: ADA-carb modifed Medications: See medication list Condition: stable Instructions: refer to practice specific booklet Discharge to: home  Follow-up: w/ [facility] @ 1 and 6 weeks   Allergies as of 04/21/2017   No Known Allergies     Medication List    TAKE these medications   acetaminophen 325 MG tablet Commonly known as:  TYLENOL Take 2 tablets (650 mg total) by mouth every 4 (four) hours as needed (for pain scale < 4).   benzocaine-Menthol 20-0.5 % Aero Commonly known as:  DERMOPLAST Apply 1 application topically as needed for irritation (perineal discomfort).   buPROPion 150 MG 24 hr tablet Commonly known as:  WELLBUTRIN XL Take 150 mg by mouth daily.   hydrochlorothiazide 25 MG tablet Commonly known as:  HYDRODIURIL Take 1 tablet (25 mg total) by mouth daily for 6 days. Start taking on:  04/22/2017   ibuprofen 600 MG tablet Commonly known as:  ADVIL,MOTRIN Take 1 tablet (600 mg total) by mouth every 6 (six) hours.   metFORMIN 750 MG 24 hr tablet Commonly known as:  GLUCOPHAGE-XR Take 750 mg by mouth 2 (two) times daily. Reported on  04/29/2015   prenatal multivitamin Tabs tablet Take 1 tablet by mouth daily at 12 noon.            Discharge Care Instructions  (From admission, onward)        Start     Ordered   04/21/17 0000  Discharge wound care:    Comments:  Sitz baths 2 times /day with warm water x 1 week   04/21/17 1105       Newborn Data: Live born female "Carleight" Birth Weight: 7 lb 14.6 oz (3590 g) APGAR: 8, 9  Newborn Delivery   Birth date/time:  04/19/2017 17:38:00 Delivery type:  Vaginal, Spontaneous     Home with mother.  Rhea PinkVivian Grice, SNM 04/21/2017 1:19 PM   Medical screening examination/treatment/procedure(s) were conducted as a shared visit with non-physician practitioner(s) and myself.  I personally evaluated the patient during the encounter.   Neta Mendsaniela C Paul, CNM, MSN 04/21/2017, 4:39 PM   D/c plan d/w CNM team, given pt's history of chronic htn I had recommended adding procardia to her d/c plan. Pt with h/o poor compliance. Unsure of the conversation with the pt at time of d/c but [facility]. If bp remains elevated will plan bp med. Pt also to have f/u with PCP or endocrine.   Signed: Lendon ColonelKelly A Cristan Hout, MD MD 04/24/2017, 2:21 PM

## 2017-04-21 NOTE — Progress Notes (Signed)
PPD #2 SVD Girl "Carleigh"  S:  Reports feeling "OK"             Tolerating po/ No nausea or vomiting             Bleeding is light             Pain controlled with Ibuprofen             Up ad lib / ambulatory / voiding w/o difficulty  Newborn feeding: Bottle   O:               VS:  Vitals:   04/21/17 0536 04/21/17 1015  BP: 124/89 (!) 143/98  Pulse: (!) 102 (!) 108  Resp: 18   Temp: 98 F (36.7 C)   SpO2:     LABS:              Recent Labs    04/19/17 1305 04/20/17 0538  WBC 7.4 12.0*  HGB 11.7* 10.1*  PLT 148* 135*   Blood type: --/--/O POS (04/12 1305)  Rubella: Immune (09/18 0000)                                 Physical Exam:             Alert and oriented X3  Abdomen: soft, non-tender, non-distended              Fundus: firm, non-tender, midline, U+1 (pendulous abd affecting position)  Perineum: Intact  Lochia: scant  Extremities: Dependent edema, no calf pain, cords, or tenderness    A:          1.31 yo G5 P5005 PP Day #2 2. Type II DM- poor adherence to oral meds during pregnancy  -fasting BG range 99-118 PP  3. Chronic HTN- stable off meds during pregnancy  -increasing PP  -LFT's increased from baseline, but remain in normal range  -absence of PEC symptoms 4. Dependent edema  -mild improvement, diuresing since starting HCTZ 5. Depression-managed on Wellbutrin 150 mg and effective in preg  -Resumed PP  P:          1. Routine PP orders 2. Continue Metformin 750 mg PO daily 3. Monitor BP per protocol, report SBP>160, DBP >90  -Follow-up @ [facility] in 1 week for BP check  -PEC precautions 4. Complete HCTZ 7 day Rx 5. Continue Wellbutrin and repeat EPDS @ 6 wk PP visit 6. D/C today  -Follow up w/ [facility]  @ 1&6 wks PP  Rhea PinkVivian Grice, [facility] 04/21/2017 12:59 PM

## 2018-06-12 ENCOUNTER — Encounter: Payer: Self-pay | Admitting: Orthopaedic Surgery

## 2018-06-12 ENCOUNTER — Ambulatory Visit (INDEPENDENT_AMBULATORY_CARE_PROVIDER_SITE_OTHER): Payer: 59

## 2018-06-12 ENCOUNTER — Other Ambulatory Visit: Payer: Self-pay

## 2018-06-12 ENCOUNTER — Ambulatory Visit (INDEPENDENT_AMBULATORY_CARE_PROVIDER_SITE_OTHER): Payer: 59 | Admitting: Orthopaedic Surgery

## 2018-06-12 VITALS — BP 151/102 | HR 117 | Temp 97.9°F | Ht 64.0 in | Wt 330.0 lb

## 2018-06-12 DIAGNOSIS — M79672 Pain in left foot: Secondary | ICD-10-CM | POA: Diagnosis not present

## 2018-06-12 DIAGNOSIS — Z6841 Body Mass Index (BMI) 40.0 and over, adult: Secondary | ICD-10-CM

## 2018-06-12 NOTE — Progress Notes (Signed)
Subjective:    Patient ID: Natalie [name], female    DOB: 1985-11-27, 33 y.o.   MRN: [id number]  HPI She has developed foot pain on the left laterally for about two weeks. She has no trauma. She has swelling and pain but no redness.  She is not getting better.  She has tried ice, heat, elevation and Advil with no help.  She is tired of hurting.  She has no other injury.   Review of Systems  Constitutional: Positive for activity change.  Musculoskeletal: Positive for arthralgias, gait problem and joint swelling.  All other systems reviewed and are negative.  For Review of Systems, all other systems reviewed and are negative.  The following is a summary of the past history medically, past history surgically, known current medicines, social history and family history.  This information is gathered electronically by the computer from prior information and documentation.  I review this each visit and have found including this information at this point in the chart is beneficial and informative.   Past Medical History:  Diagnosis Date   Depression    Gastroenteritis, acute 01/24/2015   Gestational diabetes    metformin   Gestational diabetes mellitus, delivered 11/28/2012   Headache(784.0)    Hx of varicella    Infection    UTI   Morbid obesity (HCC)    Pregnancy induced hypertension     Past Surgical History:  Procedure Laterality Date   NO PAST SURGERIES      Current Outpatient Medications on File Prior to Visit  Medication Sig Dispense Refill   buPROPion (WELLBUTRIN XL) 150 MG 24 hr tablet Take 150 mg by mouth daily.      carvedilol (COREG) 3.125 MG tablet Take 3.125 mg by mouth 2 (two) times daily with a meal.     metFORMIN (GLUCOPHAGE-XR) 750 MG 24 hr tablet Take 750 mg by mouth 2 (two) times daily. Reported on 04/29/2015     hydrochlorothiazide (HYDRODIURIL) 25 MG tablet Take 1 tablet (25 mg total) by mouth daily for 6 days. (Patient not taking: Reported on  06/12/2018) 6 tablet 0   No current facility-administered medications on file prior to visit.     Social History   Socioeconomic History   Marital status: Married    Spouse name: Not on file   Number of children: Not on file   Years of education: Not on file   Highest education level: Not on file  Occupational History   Not on file  Social Needs   Financial resource strain: Not on file   Food insecurity:    Worry: Not on file    Inability: Not on file   Transportation needs:    Medical: Not on file    Non-medical: Not on file  Tobacco Use   Smoking status: Never Smoker   Smokeless tobacco: Never Used  Substance and Sexual Activity   Alcohol use: No   Drug use: No   Sexual activity: Yes    Birth control/protection: None  Lifestyle   Physical activity:    Days per week: Not on file    Minutes per session: Not on file   Stress: Not on file  Relationships   Social connections:    Talks on phone: Not on file    Gets together: Not on file    Attends religious service: Not on file    Active member of club or organization: Not on file    Attends meetings of clubs or organizations:  Not on file    Relationship status: Not on file   Intimate partner violence:    Fear of current or ex partner: Not on file    Emotionally abused: Not on file    Physically abused: Not on file    Forced sexual activity: Not on file  Other Topics Concern   Not on file  Social History Narrative   Not on file    Family History  Problem Relation Age of Onset   Hypothyroidism Mother    Asthma Mother    Bipolar disorder Mother    Diabetes Mother    Stroke Father    Hypertension Father    Seizures Father    Cancer Father    Asthma Sister    Asthma Brother    Asthma Daughter    Diabetes Maternal Uncle    Diabetes Maternal Grandmother    Cancer Maternal Grandmother        breast   Heart disease Paternal Grandfather    Cancer Paternal Grandfather     Heart attack Paternal Grandfather    Cancer Maternal Aunt     BP (!) 151/102    Pulse (!) 117    Temp 97.9 F (36.6 C)    Ht 5\' 4"  (1.626 m)    Wt (!) 330 lb (149.7 kg)    LMP 05/20/2018    BMI 56.64 kg/m   Body mass index is 56.64 kg/m.  The patient meets the AMA guidelines for Morbid (severe) obesity with a BMI > 40.0 and I have recommended weight loss.       Objective:   Physical Exam Vitals signs reviewed.  Constitutional:      Appearance: She is well-developed.  HENT:     Head: Normocephalic and atraumatic.  Eyes:     Conjunctiva/sclera: Conjunctivae normal.     Pupils: Pupils are equal, round, and reactive to light.  Neck:     Musculoskeletal: Normal range of motion and neck supple.  Cardiovascular:     Rate and Rhythm: Normal rate and regular rhythm.  Pulmonary:     Effort: Pulmonary effort is normal.  Abdominal:     Palpations: Abdomen is soft.  Musculoskeletal:       Feet:  Skin:    General: Skin is warm and dry.  Neurological:     Mental Status: She is alert and oriented to person, place, and time.     Cranial Nerves: No cranial nerve deficit.     Motor: No abnormal muscle tone.     Coordination: Coordination normal.     Deep Tendon Reflexes: Reflexes are normal and symmetric. Reflexes normal.  Psychiatric:        Behavior: Behavior normal.        Thought Content: Thought content normal.        Judgment: Judgment normal.    X-rays were done of the left foot, reported separately.       Assessment & Plan:   Encounter Diagnoses  Name Primary?   Acute foot pain, left Yes   Body mass index 50.0-59.9, adult (HCC)    Morbid obesity (HCC)    I have recommended contrast baths.  I told her about a possible stress fracture.  I hove given a CAM walker.  Return in two weeks.  X-rays left foot on return.  Call if any problem.  Precautions discussed.   Electronically Signed Darreld McleanWayne Ethal Gotay, MD 6/4/202011:14 AM

## 2018-06-26 ENCOUNTER — Ambulatory Visit: Payer: 59 | Admitting: Orthopaedic Surgery

## 2018-07-01 ENCOUNTER — Other Ambulatory Visit: Payer: Self-pay

## 2018-07-01 ENCOUNTER — Ambulatory Visit (INDEPENDENT_AMBULATORY_CARE_PROVIDER_SITE_OTHER): Payer: 59

## 2018-07-01 ENCOUNTER — Encounter: Payer: Self-pay | Admitting: Orthopaedic Surgery

## 2018-07-01 ENCOUNTER — Ambulatory Visit (INDEPENDENT_AMBULATORY_CARE_PROVIDER_SITE_OTHER): Payer: 59 | Admitting: Orthopaedic Surgery

## 2018-07-01 VITALS — BP 132/89 | HR 100 | Temp 97.6°F | Ht 64.0 in | Wt 330.0 lb

## 2018-07-01 DIAGNOSIS — M79672 Pain in left foot: Secondary | ICD-10-CM

## 2018-07-01 DIAGNOSIS — Z6841 Body Mass Index (BMI) 40.0 and over, adult: Secondary | ICD-10-CM | POA: Diagnosis not present

## 2018-07-01 NOTE — Progress Notes (Signed)
Patient OZ:HYQMVH:Natalie [name], female DOB:09-25-1985, 33 y.o. QIO:[id number]  Chief Complaint  Patient presents with  . Foot Pain    HPI  Natalie [name] is a 33 y.o. female who has continued pain and swelling of the left foot. She has been using the CAM walker.  She has persistent pain.  X-rays were done today and are negative.  I will get MRI of the left foot.  I am concerned about stress fracture.   Body mass index is 56.64 kg/m.  The patient meets the AMA guidelines for Morbid (severe) obesity with a BMI > 40.0 and I have recommended weight loss.   ROS  Review of Systems  Constitutional: Positive for activity change.  Musculoskeletal: Positive for arthralgias, gait problem and joint swelling.  All other systems reviewed and are negative.   All other systems reviewed and are negative.  The following is a summary of the past history medically, past history surgically, known current medicines, social history and family history.  This information is gathered electronically by the computer from prior information and documentation.  I review this each visit and have found including this information at this point in the chart is beneficial and informative.    Past Medical History:  Diagnosis Date  . Depression   . Gastroenteritis, acute 01/24/2015  . Gestational diabetes    metformin  . Gestational diabetes mellitus, delivered 11/28/2012  . Headache(784.0)   . Hx of varicella   . Infection    UTI  . Morbid obesity (HCC)   . Pregnancy induced hypertension     Past Surgical History:  Procedure Laterality Date  . NO PAST SURGERIES      Family History  Problem Relation Age of Onset  . Hypothyroidism Mother   . Asthma Mother   . Bipolar disorder Mother   . Diabetes Mother   . Stroke Father   . Hypertension Father   . Seizures Father   . Cancer Father   . Asthma Sister   . Asthma Brother   . Asthma Daughter   . Diabetes Maternal Uncle   . Diabetes Maternal Grandmother    . Cancer Maternal Grandmother        breast  . Heart disease Paternal Grandfather   . Cancer Paternal Grandfather   . Heart attack Paternal Grandfather   . Cancer Maternal Aunt     Social History Social History   Tobacco Use  . Smoking status: Never Smoker  . Smokeless tobacco: Never Used  Substance Use Topics  . Alcohol use: No  . Drug use: No    No Known Allergies  Current Outpatient Medications  Medication Sig Dispense Refill  . buPROPion (WELLBUTRIN XL) 150 MG 24 hr tablet Take 150 mg by mouth daily.     . carvedilol (COREG) 3.125 MG tablet Take 3.125 mg by mouth 2 (two) times daily with a meal.    . hydrochlorothiazide (HYDRODIURIL) 25 MG tablet Take 1 tablet (25 mg total) by mouth daily for 6 days. (Patient not taking: Reported on 06/12/2018) 6 tablet 0  . metFORMIN (GLUCOPHAGE-XR) 750 MG 24 hr tablet Take 750 mg by mouth 2 (two) times daily. Reported on 04/29/2015     No current facility-administered medications for this visit.      Physical Exam  Blood pressure 132/89, pulse 100, temperature 97.6 F (36.4 C), height 5\' 4"  (1.626 m), weight (!) 330 lb (149.7 kg), last menstrual period 06/25/2018, unknown if currently breastfeeding.  Constitutional: overall normal hygiene, normal nutrition, well  developed, normal grooming, normal body habitus. Assistive device:CAM walker left foot  Musculoskeletal: gait and station Limp left, muscle tone and strength are normal, no tremors or atrophy is present.  .  Neurological: coordination overall normal.  Deep tendon reflex/nerve stretch intact.  Sensation normal.  Cranial nerves II-XII intact.   Skin:   Normal overall no scars, lesions, ulcers or rashes. No psoriasis.  Psychiatric: Alert and oriented x 3.  Recent memory intact, remote memory unclear.  Normal mood and affect. Well groomed.  Good eye contact.  Cardiovascular: overall no swelling, no varicosities, no edema bilaterally, normal temperatures of the legs and arms,  no clubbing, cyanosis and good capillary refill.  Lymphatic: palpation is normal.  Left foot and ankle have swelling, NV intact. ROM is full, limp left.  All other systems reviewed and are negative   The patient has been educated about the nature of the problem(s) and counseled on treatment options.  The patient appeared to understand what I have discussed and is in agreement with it.  Encounter Diagnoses  Name Primary?  . Acute foot pain, left Yes  . Body mass index 50.0-59.9, adult ([place])   . Morbid obesity (HCC)    X-rays were done of the left foot today, negative.  PLAN Call if any problems.  Precautions discussed.  Continue current medications.   Return to clinic MRI of the left foot   Electronically Signed Sanjuana Kava, MD 6/23/20209:38 AM

## 2018-08-01 ENCOUNTER — Ambulatory Visit
Admission: RE | Admit: 2018-08-01 | Discharge: 2018-08-01 | Disposition: A | Discharge status: Home / Self Care | Payer: 59 | Source: Ambulatory Visit | Attending: Orthopaedic Surgery | Admitting: Orthopaedic Surgery

## 2018-08-01 ENCOUNTER — Other Ambulatory Visit: Payer: Self-pay

## 2018-08-01 DIAGNOSIS — M79672 Pain in left foot: Secondary | ICD-10-CM

## 2018-08-20 ENCOUNTER — Ambulatory Visit: Payer: 59 | Admitting: Orthopaedic Surgery

## 2018-08-20 ENCOUNTER — Other Ambulatory Visit: Payer: Self-pay

## 2018-08-21 ENCOUNTER — Encounter: Payer: Self-pay | Admitting: Orthopaedic Surgery

## 2018-08-21 ENCOUNTER — Ambulatory Visit (INDEPENDENT_AMBULATORY_CARE_PROVIDER_SITE_OTHER): Payer: 59 | Admitting: Orthopaedic Surgery

## 2018-08-21 DIAGNOSIS — Z6841 Body Mass Index (BMI) 40.0 and over, adult: Secondary | ICD-10-CM | POA: Diagnosis not present

## 2018-08-21 DIAGNOSIS — M79672 Pain in left foot: Secondary | ICD-10-CM | POA: Diagnosis not present

## 2018-08-21 NOTE — Progress Notes (Signed)
Virtual Visit via Telephone Note  I connected with@ on 08/21/18 at 10:30 AM EDT by telephone and verified that I am speaking with the correct person using two identifiers.  Location: Patient: home Provider: office   I discussed the limitations, risks, security and privacy concerns of performing an evaluation and management service by telephone and the availability of in person appointments. I also discussed with the patient that there may be a patient responsible charge related to this service. The patient expressed understanding and agreed to proceed.   History of Present Illness: She had the MRI of the foot and is better.  It showed  IMPRESSION: 1. No appreciable bone abnormality of the midfoot or forefoot. 2. Nonspecific subcutaneous edema on the dorsum of the foot with a small amount of fluid in the intermetatarsal bursae as described which I suspect originates from the edema in the dorsum of the foot rather than representing true intermetatarsal bursitis. 3. Normal appearing muscles and tendons of the midfoot and forefoot.  I explained the findings to her.  She is better having less pain and walking more.   Observations/Objective: Per above.  Assessment and Plan: Encounter Diagnoses  Name Primary?  . Acute foot pain, left Yes  . Body mass index 50.0-59.9, adult ([place])   . Morbid obesity ([place])      Follow Up Instructions: One month. Call and cancel if doing well.   I discussed the assessment and treatment plan with the patient. The patient was provided an opportunity to ask questions and all were answered. The patient agreed with the plan and demonstrated an understanding of the instructions.   The patient was advised to call back or seek an in-person evaluation if the symptoms worsen or if the condition fails to improve as anticipated.  I provided 8 minutes of non-face-to-face time during this encounter.   Sanjuana Kava, MD

## 2018-09-17 ENCOUNTER — Ambulatory Visit (INDEPENDENT_AMBULATORY_CARE_PROVIDER_SITE_OTHER): Payer: 59 | Admitting: Orthopaedic Surgery

## 2018-09-17 ENCOUNTER — Encounter: Payer: Self-pay | Admitting: Orthopaedic Surgery

## 2018-09-17 ENCOUNTER — Other Ambulatory Visit: Payer: Self-pay

## 2018-09-17 DIAGNOSIS — M79671 Pain in right foot: Secondary | ICD-10-CM | POA: Diagnosis not present

## 2018-09-17 DIAGNOSIS — G8929 Other chronic pain: Secondary | ICD-10-CM

## 2018-09-17 MED ORDER — NAPROXEN 500 MG PO TABS: 500.0000 mg | ORAL_TABLET | Freq: Two times a day (BID) | ORAL | 5 refills | Status: DC

## 2018-09-17 NOTE — Progress Notes (Signed)
Virtual Visit via Telephone Note  I connected with@ on 09/17/18 at  9:30 AM EDT by telephone and verified that I am speaking with the correct person using two identifiers.  Location: Patient: home Provider: home   I discussed the limitations, risks, security and privacy concerns of performing an evaluation and management service by telephone and the availability of in person appointments. I also discussed with the patient that there may be a patient responsible charge related to this service. The patient expressed understanding and agreed to proceed.   History of Present Illness: She has swelling of the right foot when she wears closed shoe.  She has only slight swelling with sandals.  She has no redness, no new trauma.  She has tried various things for her shoe and it has not helped.  She takes an occasional Aleve and that helps.  I will call in Naprosyn 500 po bid pc for her and see if she gets better results.   Observations/Objective: Per above.  Assessment and Plan: Encounter Diagnosis  Name Primary?  . Chronic pain in right foot Yes     Follow Up Instructions: One Month.  Take the Naprosyn.   I discussed the assessment and treatment plan with the patient. The patient was provided an opportunity to ask questions and all were answered. The patient agreed with the plan and demonstrated an understanding of the instructions.   The patient was advised to call back or seek an in-person evaluation if the symptoms worsen or if the condition fails to improve as anticipated.  I provided 8 minutes of non-face-to-face time during this encounter.   Sanjuana Kava, MD

## 2018-10-15 ENCOUNTER — Ambulatory Visit (INDEPENDENT_AMBULATORY_CARE_PROVIDER_SITE_OTHER): Payer: 59 | Admitting: Orthopaedic Surgery

## 2018-10-15 ENCOUNTER — Other Ambulatory Visit: Payer: Self-pay

## 2018-10-15 ENCOUNTER — Encounter: Payer: Self-pay | Admitting: Orthopaedic Surgery

## 2018-10-15 DIAGNOSIS — Z6841 Body Mass Index (BMI) 40.0 and over, adult: Secondary | ICD-10-CM | POA: Diagnosis not present

## 2018-10-15 DIAGNOSIS — G8929 Other chronic pain: Secondary | ICD-10-CM

## 2018-10-15 DIAGNOSIS — M79671 Pain in right foot: Secondary | ICD-10-CM

## 2018-10-15 NOTE — Patient Instructions (Signed)
I have told her about [facility] and she may call and make appointment to go there to lose weight.

## 2018-10-15 NOTE — Progress Notes (Signed)
Virtual Visit via Telephone Note  I connected with@ on 10/15/18 at  9:10 AM EDT by telephone and verified that I am speaking with the correct person using two identifiers.  Location: Patient: home Provider: home   I discussed the limitations, risks, security and privacy concerns of performing an evaluation and management service by telephone and the availability of in person appointments. I also discussed with the patient that there may be a patient responsible charge related to this service. The patient expressed understanding and agreed to proceed.   History of Present Illness: Her foot is better.  The Naprosyn has helped.  She is walking more.  We spent about 15 minutes talking about weight reduction and types of food to eat.  I asked her to keep a diary for a week of everything she ate.  I explained about increasing protein intake and checking calories of the foods she eats.  I have told her about the [facility] in [place].  She really wants to lose weight and keep it off.   Observations/Objective: Per above.  Assessment and Plan: Encounter Diagnoses  Name Primary?  . Chronic pain in right foot Yes  . Body mass index 50.0-59.9, adult ([place])   . Morbid obesity ([place])      Follow Up Instructions:   I will see as needed.  I discussed the assessment and treatment plan with the patient. The patient was provided an opportunity to ask questions and all were answered. The patient agreed with the plan and demonstrated an understanding of the instructions.   The patient was advised to call back or seek an in-person evaluation if the symptoms worsen or if the condition fails to improve as anticipated.  I provided 17 minutes of non-face-to-face time during this encounter.   Sanjuana Kava, MD

## 2018-12-20 ENCOUNTER — Emergency Department (HOSPITAL_COMMUNITY)
Admission: EM | Admit: 2018-12-20 | Discharge: 2018-12-20 | Disposition: A | Discharge status: Home / Self Care | Payer: 59 | Attending: Emergency Medicine | Admitting: Emergency Medicine

## 2018-12-20 ENCOUNTER — Other Ambulatory Visit: Payer: Self-pay

## 2018-12-20 ENCOUNTER — Emergency Department (HOSPITAL_COMMUNITY): Payer: 59

## 2018-12-20 ENCOUNTER — Encounter (HOSPITAL_COMMUNITY): Payer: Self-pay

## 2018-12-20 DIAGNOSIS — R1011 Right upper quadrant pain: Secondary | ICD-10-CM | POA: Diagnosis not present

## 2018-12-20 DIAGNOSIS — I10 Essential (primary) hypertension: Secondary | ICD-10-CM | POA: Diagnosis not present

## 2018-12-20 DIAGNOSIS — Z79899 Other long term (current) drug therapy: Secondary | ICD-10-CM | POA: Insufficient documentation

## 2018-12-20 DIAGNOSIS — R109 Unspecified abdominal pain: Secondary | ICD-10-CM

## 2018-12-20 LAB — CBC WITH DIFFERENTIAL/PLATELET
Abs Immature Granulocytes: 0.05 10*3/uL (ref 0.00–0.07)
Basophils Absolute: 0 10*3/uL (ref 0.0–0.1)
Basophils Relative: 1 %
Eosinophils Absolute: 0.1 10*3/uL (ref 0.0–0.5)
Eosinophils Relative: 2 %
HCT: 38.2 % (ref 36.0–46.0)
Hemoglobin: 12.3 g/dL (ref 12.0–15.0)
Immature Granulocytes: 1 %
Lymphocytes Relative: 34 %
Lymphs Abs: 2 10*3/uL (ref 0.7–4.0)
MCH: 27 pg (ref 26.0–34.0)
MCHC: 32.2 g/dL (ref 30.0–36.0)
MCV: 84 fL (ref 80.0–100.0)
Monocytes Absolute: 0.8 10*3/uL (ref 0.1–1.0)
Monocytes Relative: 15 %
Neutro Abs: 2.8 10*3/uL (ref 1.7–7.7)
Neutrophils Relative %: 47 %
Platelets: 281 10*3/uL (ref 150–400)
RBC: 4.55 MIL/uL (ref 3.87–5.11)
RDW: 15 % (ref 11.5–15.5)
WBC: 5.7 10*3/uL (ref 4.0–10.5)
nRBC: 0 % (ref 0.0–0.2)

## 2018-12-20 LAB — COMPREHENSIVE METABOLIC PANEL
ALT: 13 U/L (ref 0–44)
AST: 15 U/L (ref 15–41)
Albumin: 3.4 g/dL — ABNORMAL LOW (ref 3.5–5.0)
Alkaline Phosphatase: 52 U/L (ref 38–126)
Anion gap: 10 (ref 5–15)
BUN: 8 mg/dL (ref 6–20)
CO2: 21 mmol/L — ABNORMAL LOW (ref 22–32)
Calcium: 9 mg/dL (ref 8.9–10.3)
Chloride: 109 mmol/L (ref 98–111)
Creatinine, Ser: 0.74 mg/dL (ref 0.44–1.00)
GFR calc Af Amer: >60 mL/min (ref >60)
GFR calc non Af Amer: >60 mL/min (ref >60)
Glucose, Bld: 103 mg/dL — ABNORMAL HIGH (ref 70–99)
Potassium: 3.8 mmol/L (ref 3.5–5.1)
Sodium: 140 mmol/L (ref 135–145)
Total Bilirubin: 0.5 mg/dL (ref 0.3–1.2)
Total Protein: 7 g/dL (ref 6.5–8.1)

## 2018-12-20 LAB — URINALYSIS, ROUTINE W REFLEX MICROSCOPIC
Bilirubin Urine: NEGATIVE
Glucose, UA: NEGATIVE mg/dL
Hgb urine dipstick: NEGATIVE
Ketones, ur: NEGATIVE mg/dL
Leukocytes,Ua: NEGATIVE
Nitrite: NEGATIVE
Protein, ur: NEGATIVE mg/dL
Specific Gravity, Urine: 1.015 (ref 1.005–1.030)
pH: 6 (ref 5.0–8.0)

## 2018-12-20 LAB — POC URINE PREG, ED: Preg Test, Ur: NEGATIVE

## 2018-12-20 LAB — LIPASE, BLOOD: Lipase: 16 U/L (ref 11–51)

## 2018-12-20 MED ORDER — METHOCARBAMOL 500 MG PO TABS: 500.0000 mg | ORAL_TABLET | Freq: Two times a day (BID) | ORAL | 0 refills | Status: DC

## 2018-12-20 MED ORDER — MELOXICAM 15 MG PO TABS: 15.0000 mg | ORAL_TABLET | Freq: Every day | ORAL | 0 refills | Status: DC

## 2018-12-20 MED ORDER — IBUPROFEN 800 MG PO TABS
800.0000 mg | ORAL_TABLET | Freq: Once | ORAL | Status: AC
  Administered 2018-12-20: 13:00:00 800 mg via ORAL
  Filled 2018-12-20: qty 1

## 2018-12-20 MED ORDER — IOHEXOL 300 MG/ML  SOLN
100.0000 mL | Freq: Once | INTRAMUSCULAR | Status: AC | PRN
  Administered 2018-12-20: 16:00:00 100 mL via INTRAVENOUS

## 2018-12-20 NOTE — Discharge Instructions (Addendum)
As discussed, your CT scan was normal except for a possible ovarian vein thrombosis. Please follow up with your OBGYN within the next week for further evaluation. I have scheduled you for an ultrasound on Monday. Please call Monday morning to schedule a time. I am sending you home with a muscle relaxer called Robaxin. It can cause drowsiness, so do not drive or operate machinery while on the medication. I am also sending you home with a pain medication called Meloxicam. You may take 1 tablet a day. Do not mix with other over the counter medications. Return to the ER for new or worsening symptoms.

## 2018-12-20 NOTE — ED Triage Notes (Signed)
Pt presents to ED with complaints of right sided flank pain x 1 week. Pt states at times it radiates to back and abdomen. Pt denies ua symptoms.

## 2018-12-20 NOTE — ED Provider Notes (Signed)
[facility] EMERGENCY DEPARTMENT Provider Note   CSN: [id number] Arrival date & time: 12/20/18  1055     History Chief Complaint  Patient presents with  . Flank Pain    Natalie [name] is a 33 y.o. female with a past medical history significant for morbid obesity, depression, hypertension, and prediabetes who presents to the ED due to worsening right flank pain x1 week.  Patient states that the pain is constant and sharp in nature and radiates to her right upper quadrant and back.  Patient states it feels like "Braxton hicks contractions".  She has tried Tylenol with no relief.  Patient denies history of kidney stones.  Patient denies vaginal and urinary symptoms.  Patient is married and only sexually active with her husband.  Patient's last menstrual period was November 16.  Patient admits to nausea, but denies vomiting and diarrhea.  Patient states pain is better when standing and walking around, but worse when sitting and lying down.  Patient denies fever and chills. Patient denies chest pain and shortness of breath.    Past Medical History:  Diagnosis Date  . Depression   . Gastroenteritis, acute 01/24/2015  . Gestational diabetes    metformin  . Gestational diabetes mellitus, delivered 11/28/2012  . Headache(784.0)   . Hx of varicella   . Infection    UTI  . Morbid obesity (HCC)   . Pregnancy induced hypertension     Patient Active Problem List   Diagnosis Date Noted  . Postpartum care following vaginal delivery (4/12) 04/19/2017  . Pre-existing type 2 diabetes mellitus during pregnancy 01/28/2015  . Thrombocytopenia (HCC) 01/28/2015  . Dependent edema 01/28/2015  . Chronic hypertension in pregnancy 12/29/2013  . SVD (spontaneous vaginal delivery) 4/12 12/28/2013  . Pre-existing type 2 diabetes mellitus during pregnancy in second trimester 11/12/2013  . Essential hypertension 11/27/2012  . Maternal diabetes mellitus, classes B through R 08/27/2012    Past Surgical  History:  Procedure Laterality Date  . NO PAST SURGERIES       OB History    Gravida  5   Para  5   Term  5   Preterm  0   AB  0   Living  5     SAB  0   TAB  0   Ectopic  0   Multiple  0   Live Births  5           Family History  Problem Relation Age of Onset  . Hypothyroidism Mother   . Asthma Mother   . Bipolar disorder Mother   . Diabetes Mother   . Stroke Father   . Hypertension Father   . Seizures Father   . Cancer Father   . Asthma Sister   . Asthma Brother   . Asthma Daughter   . Diabetes Maternal Uncle   . Diabetes Maternal Grandmother   . Cancer Maternal Grandmother        breast  . Heart disease Paternal Grandfather   . Cancer Paternal Grandfather   . Heart attack Paternal Grandfather   . Cancer Maternal Aunt     Social History   Tobacco Use  . Smoking status: Never Smoker  . Smokeless tobacco: Never Used  Substance Use Topics  . Alcohol use: No  . Drug use: No    Home Medications Prior to Admission medications   Medication Sig Start Date End Date Taking? Authorizing Provider  buPROPion (WELLBUTRIN XL) 150 MG 24 hr tablet Take  150 mg by mouth daily.  01/03/17   [provider]  carvedilol (COREG) 3.125 MG tablet Take 3.125 mg by mouth 2 (two) times daily with a meal.    [provider]  hydrochlorothiazide (HYDRODIURIL) 25 MG tablet Take 1 tablet (25 mg total) by mouth daily for 6 days. Patient not taking: Reported on 06/12/2018 04/22/17 04/28/17  Neta MendsPaul, Daniela C, CNM  meloxicam (MOBIC) 15 MG tablet Take 1 tablet (15 mg total) by mouth daily. 12/20/18   Cheek, Vesta Mixeraroline B, PA-C  metFORMIN (GLUCOPHAGE-XR) 750 MG 24 hr tablet Take 750 mg by mouth 2 (two) times daily. Reported on 04/29/2015    [provider]  methocarbamol (ROBAXIN) 500 MG tablet Take 1 tablet (500 mg total) by mouth 2 (two) times daily. 12/20/18   Cheek, Vesta Mixeraroline B, PA-C  naproxen (NAPROSYN) 500 MG tablet Take 1 tablet (500 mg total) by mouth  2 (two) times daily with a meal. 09/17/18   Darreld McleanKeeling, Wayne, MD    Allergies    Patient has no known allergies.  Review of Systems   Review of Systems  Constitutional: Negative for chills and fever.  Respiratory: Negative for shortness of breath.   Cardiovascular: Negative for chest pain.  Gastrointestinal: Positive for abdominal pain and nausea. Negative for diarrhea and vomiting.  Genitourinary: Positive for flank pain. Negative for difficulty urinating, dyspareunia, dysuria, pelvic pain, vaginal bleeding, vaginal discharge and vaginal pain.  Musculoskeletal: Positive for back pain and myalgias.  All other systems reviewed and are negative.   Physical Exam Updated Vital Signs BP (!) 151/102 (BP Location: Right Arm)   Pulse (!) 105   Temp 98.6 F (37 C) (Oral)   Resp 18   Ht 5\' 3"  (1.6 m)   Wt (!) 140.6 kg   SpO2 98%   BMI 54.91 kg/m   Physical Exam Vitals and nursing note reviewed.  Constitutional:      General: She is not in acute distress.    Appearance: She is obese. She is not ill-appearing.  HENT:     Head: Normocephalic.  Eyes:     Conjunctiva/sclera: Conjunctivae normal.  Cardiovascular:     Rate and Rhythm: Regular rhythm. Tachycardia present.     Pulses: Normal pulses.     Heart sounds: Normal heart sounds. No murmur. No friction rub. No gallop.   Pulmonary:     Effort: Pulmonary effort is normal.     Breath sounds: Normal breath sounds.  Abdominal:     General: Abdomen is flat. Bowel sounds are normal. There is no distension.     Palpations: Abdomen is soft.     Tenderness: There is abdominal tenderness. There is no right CVA tenderness, left CVA tenderness, guarding or rebound.     Comments: Tenderness to palpation in right flank region and RUQ with no guarding or rebound. Negative Murphy's sign. Negative tenderness at McBurney's point. No peritoneal signs  Musculoskeletal:        General: Normal range of motion.     Cervical back: Normal range of motion  and neck supple. No rigidity or tenderness.     Right lower leg: No edema.     Left lower leg: No edema.     Comments: Able to move all 4 extremities without difficulty.  Skin:    General: Skin is warm.     Capillary Refill: Capillary refill takes less than 2 seconds.  Neurological:     General: No focal deficit present.     Mental Status:  She is alert.     ED Results / Procedures / Treatments   Labs (all labs ordered are listed, but only abnormal results are displayed) Labs Reviewed  URINALYSIS, ROUTINE W REFLEX MICROSCOPIC - Abnormal; Notable for the following components:      Result Value   APPearance CLOUDY (*)    All other components within normal limits  COMPREHENSIVE METABOLIC PANEL - Abnormal; Notable for the following components:   CO2 21 (*)    Glucose, Bld 103 (*)    Albumin 3.4 (*)    All other components within normal limits  CBC WITH DIFFERENTIAL/PLATELET  LIPASE, BLOOD  POC URINE PREG, ED    EKG None  Radiology CT ABDOMEN PELVIS W CONTRAST  Result Date: 12/20/2018 CLINICAL DATA:  Right-sided flank pain, kidney stones suspected EXAM: CT ABDOMEN AND PELVIS WITH CONTRAST TECHNIQUE: Multidetector CT imaging of the abdomen and pelvis was performed using the standard protocol following bolus administration of intravenous contrast. CONTRAST:  141mL OMNIPAQUE IOHEXOL 300 MG/ML  SOLN COMPARISON:  None. FINDINGS: Lower chest: No acute abnormality. Hepatobiliary: No solid liver abnormality is seen. No gallstones, gallbladder wall thickening, or biliary dilatation. Pancreas: Unremarkable. No pancreatic ductal dilatation or surrounding inflammatory changes. Spleen: Normal in size without significant abnormality. Adrenals/Urinary Tract: Adrenal glands are unremarkable. Kidneys are normal, without renal calculi, solid lesion, or hydronephrosis. Bladder is unremarkable. Stomach/Bowel: Stomach is within normal limits. Appendix appears normal. No evidence of bowel wall thickening,  distention, or inflammatory changes. Vascular/Lymphatic: The right ovarian vein is thickened, hypoattenuating, and with adjacent fat stranding (series 2, image 50, series 5, image 63). No enlarged abdominal or pelvic lymph nodes. Reproductive: No mass or other significant abnormality. Other: No abdominal wall hernia or abnormality. No abdominopelvic ascites. Musculoskeletal: No acute or significant osseous findings. IMPRESSION: 1. The right ovarian vein is thickened, hypoattenuating, and with adjacent fat stranding (series 2, image 50, series 5, image 63). 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 2. No other CT findings of the abdomen or pelvis to explain pain. Electronically Signed   By: Eddie Candle M.D.   On: 12/20/2018 16:16    Procedures Procedures (including critical care time)  Medications Ordered in ED Medications  ibuprofen (ADVIL) tablet 800 mg (800 mg Oral Given 12/20/18 1320)  iohexol (OMNIPAQUE) 300 MG/ML solution 100 mL (100 mLs Intravenous Contrast Given 12/20/18 1557)    ED Course  I have reviewed the triage vital signs and the nursing notes.  Pertinent labs & imaging results that were available during my care of the patient were reviewed by me and considered in my medical decision making (see chart for details).  Clinical Course as of Dec 19 1744  Sat Dec 20, 2018  1715 Spoke to Dr. Kennon Rounds who recommends outpatient OBGYN within the next week for further evaluation of possible ovarian vein thrombosis.    [CC]    Clinical Course User Index [CC] Cheek, Comer Locket, PA-C   MDM Rules/Calculators/A&P     CHA2DS2/VAS Stroke Risk Points      N/A >= 2 Points: High Risk  1 - 1.99 Points: Medium Risk  0 Points: Low Risk    A final score could not be computed because of missing components.: Last  Change: N/A     This score determines the patient's risk of having a stroke if the  patient has atrial fibrillation.      This score is not applicable  to this patient. Components are not  calculated.  33 year old female presents to the ED due to right flank pain that radiates to her right upper quadrant and right shoulder blade.  Patient denies history of kidney stones.  Upon arrival, patient is tachycardic at 105, afebrile with elevated BP at 151/102.  Reproducible tenderness in the right flank region and right upper quadrant.  Negative CVA tenderness bilaterally. Negative murphy's sign. Suspect pain is related to kidney stones vs. Gallbladder etiology vs. pyelonephritis vs. Muscle spasms. Unfortunately, [place] has already left for today, so will obtain CT scan of abdomen. Will do with contrast for better visualization of gallbladder.   UA negative for signs of infection or hematuria. Doubt UTI/pylenonephritis at this time. CBC unremarkable with no leukocytosis.  Urine pregnancy negative.  CMP significant for mild hyperglycemia at 103, CO2 21, albumin 3.4.  Lipase normal at 16.  Doubt pancreatitis at this time. CT personally reviewed which demonstrates: 1. The right ovarian vein is thickened, hypoattenuating, and with  adjacent fat stranding (series 2, image 50, series 5, image 63).  Findings are consistent with ovarian vein thrombosis or  thrombophlebitis.   Spoke to Dr. Shawnie Pons with OBGYN. See note above.  Advised patient to follow-up with her OB/GYN within the next week for further evaluation.  Scheduled an outpatient right upper quadrant ultrasound for Monday.  Patient advised to call Monday to schedule an appointment. Will treat symptomatically for muscle spasms with Robaxin and meloxicam.  Patient has been advised that Robaxin can cause drowsiness and do not drive or operate machinery while on the medication. Strict ED precautions discussed with patient. Patient states understanding and agrees to plan. Patient discharged home in no acute distress and stable vitals   Discussed case with Dr. Manus Gunning who evaluated patient and agrees  with assessment and plan. Final Clinical Impression(s) / ED Diagnoses Final diagnoses:  RUQ pain  Right flank pain    Rx / DC Orders ED Discharge Orders         Ordered    US Abdomen Limited RUQ/Gall Gladder     12/20/18 1702    methocarbamol (ROBAXIN) 500 MG tablet  2 times daily     12/20/18 1722    meloxicam (MOBIC) 15 MG tablet  Daily     12/20/18 [address] 12/20/18 1746    Glynn Octave, MD 12/20/18 1916

## 2019-01-23 ENCOUNTER — Ambulatory Visit (INDEPENDENT_AMBULATORY_CARE_PROVIDER_SITE_OTHER): Payer: 59

## 2019-01-23 ENCOUNTER — Ambulatory Visit (INDEPENDENT_AMBULATORY_CARE_PROVIDER_SITE_OTHER): Payer: 59 | Admitting: Podiatry

## 2019-01-23 ENCOUNTER — Encounter: Payer: Self-pay | Admitting: Podiatry

## 2019-01-23 ENCOUNTER — Other Ambulatory Visit: Payer: Self-pay

## 2019-01-23 VITALS — Temp 97.3°F

## 2019-01-23 DIAGNOSIS — M79672 Pain in left foot: Secondary | ICD-10-CM | POA: Diagnosis not present

## 2019-01-23 DIAGNOSIS — M7752 Other enthesopathy of left foot: Secondary | ICD-10-CM

## 2019-01-23 DIAGNOSIS — M779 Enthesopathy, unspecified: Secondary | ICD-10-CM

## 2019-01-23 DIAGNOSIS — M79671 Pain in right foot: Secondary | ICD-10-CM

## 2019-01-23 MED ORDER — DICLOFENAC SODIUM 75 MG PO TBEC: 75.0000 mg | DELAYED_RELEASE_TABLET | Freq: Two times a day (BID) | ORAL | 2 refills | Status: DC

## 2019-01-23 NOTE — Progress Notes (Signed)
Subjective:   Patient ID: Natalie [name], female   DOB: 34 y.o.   MRN: [id number]   HPI Patient presents stating she hurt her foot in June and so far has continued to have problems with it.  She is very obese which is complicating factor and is having to be on her foot at all times.  Patient has a boot which helps her a little bit but not much and has had MRI which was negative.  Patient does not smoke likes to be active   Review of Systems  All other systems reviewed and are negative.       Objective:  Physical Exam Vitals and nursing note reviewed.  Constitutional:      Appearance: She is well-developed.  Pulmonary:     Effort: Pulmonary effort is normal.  Musculoskeletal:        General: Normal range of motion.  Skin:    General: Skin is warm.  Neurological:     Mental Status: She is alert.     Neurovascular status found to be intact muscle strength was found to be adequate range of motion within normal limits.  Patient is noted to have inflammation pain of the sinus tarsi left with mild drip reduction of motion is quite a bit of edema in the feet left over right with negative Denna Haggard' sign noted.  Patient is markedly obese which is complicating factor to this condition     Assessment:  Possibility for acute sinus tarsitis left or possible bony injury with edema noted     Plan:  H&P conditions reviewed and today we did a sterile prep and injected the sinus tarsi left 3 mg Kenalog 5 mg Xylocaine and went ahead and applied ankle compression stocking along with elevation.  X-rays were reviewed and discussed and we will see her back in 3 weeks  X-rays indicate that there is no indications of fracture or diastases injury or what appears to be subtalar joint arthritis noted

## 2019-02-09 ENCOUNTER — Encounter: Payer: Self-pay | Admitting: Podiatry

## 2019-02-09 ENCOUNTER — Other Ambulatory Visit: Payer: Self-pay

## 2019-02-09 ENCOUNTER — Ambulatory Visit (INDEPENDENT_AMBULATORY_CARE_PROVIDER_SITE_OTHER): Payer: 59 | Admitting: Podiatry

## 2019-02-09 VITALS — Temp 97.1°F

## 2019-02-09 DIAGNOSIS — M779 Enthesopathy, unspecified: Secondary | ICD-10-CM

## 2019-02-09 DIAGNOSIS — M79672 Pain in left foot: Secondary | ICD-10-CM | POA: Diagnosis not present

## 2019-02-11 NOTE — Progress Notes (Signed)
Subjective:   Patient ID: Natalie [name], female   DOB: 34 y.o.   MRN: [id number]   HPI Patient states seems to be doing better with pain still present upon deep palpation but overall is pleased with how it is doing.  States the right foot also is doing well now and she is walking with a better heel toe gait   ROS      Objective:  Physical Exam  Neurovascular status intact with patient's left foot healing well with diminished discomfort and only pain upon deep compression with no indications of pathology with muscle testing.  He is utilizing ice at the current time F2     Assessment:  Overall improving from having had inflammatory reaction both left and right foot with the peroneal tendon being last      Plan:  H&P reviewed condition and discussed continued over-the-counter anti-inflammatories along with physical therapy brace usage shoe gear modifications.  I discussed activities which would be appropriate at this time and patient will try to return to normal activity over the next 2 to 4 weeks and will be seen back to recheck as needed

## 2019-04-22 ENCOUNTER — Encounter: Payer: Self-pay | Admitting: Podiatry

## 2019-04-22 ENCOUNTER — Ambulatory Visit (INDEPENDENT_AMBULATORY_CARE_PROVIDER_SITE_OTHER): Payer: 59 | Admitting: Podiatry

## 2019-04-22 ENCOUNTER — Other Ambulatory Visit: Payer: Self-pay

## 2019-04-22 VITALS — Temp 97.4°F

## 2019-04-22 DIAGNOSIS — Q820 Hereditary lymphedema: Secondary | ICD-10-CM

## 2019-04-22 DIAGNOSIS — M779 Enthesopathy, unspecified: Secondary | ICD-10-CM

## 2019-04-23 NOTE — Progress Notes (Signed)
Subjective:   Patient ID: Natalie [name], female   DOB: 34 y.o.   MRN: [id number]   HPI Patient states her feet are improving but she still gets some significant swelling of her left foot and the compression does not seem to make a difference   ROS      Objective:  Physical Exam  Neurovascular status intact with patient's left and right forefoot showing improvement with diminished edema inflammation of the lesser MPJ with quite a bit of edema nonpitting left with negative Denna Haggard' sign noted F2 improvement     Assessment:  Capsulitis left that has improved with significant edema left foot with negative Denna Haggard' sign noted with probability that this is lymphedema     Plan:  H&P reviewed conditions and at this point I discussed this is a very difficult problem there is no real good solutions and I did offer the consideration for vein center but she denies wanting this and I am not sure if that will be able to do anything to help her.  I encouraged her to call

## 2019-06-24 ENCOUNTER — Encounter: Payer: Self-pay | Admitting: Podiatry

## 2019-06-24 ENCOUNTER — Ambulatory Visit (INDEPENDENT_AMBULATORY_CARE_PROVIDER_SITE_OTHER): Payer: 59 | Admitting: Podiatry

## 2019-06-24 ENCOUNTER — Other Ambulatory Visit: Payer: Self-pay

## 2019-06-24 VITALS — Temp 96.8°F

## 2019-06-24 DIAGNOSIS — M7672 Peroneal tendinitis, left leg: Secondary | ICD-10-CM

## 2019-06-24 DIAGNOSIS — M7671 Peroneal tendinitis, right leg: Secondary | ICD-10-CM | POA: Diagnosis not present

## 2019-06-24 DIAGNOSIS — M7670 Peroneal tendinitis, unspecified leg: Secondary | ICD-10-CM

## 2019-06-25 NOTE — Progress Notes (Signed)
Subjective:   Patient ID: Natalie [name], female   DOB: 34 y.o.   MRN: [id number]   HPI Patient presents stating that the swelling is the same and she gets pain on both feet outside and she would like injections   ROS      Objective:  Physical Exam  Neurovascular status intact with pain now that is mostly present around the peroneal tendon group bilateral with swelling which is occurred with small 3 x 3 mm nodule outside right foot that she feels is new     Assessment:  Peroneal tendinitis bilateral with possibility for small inflammatory change right     Plan:  H&P reviewed condition sterile prep done injected the peroneal complex bilateral 3 mg Kenalog 5 mg Xylocaine advised on reduced activity for several days reappoint as needed

## 2020-12-11 IMAGING — CT CT ABD-PELV W/ CM
2 of 4 series · 17 of 46 positions shown, 19 images · IV contrast (Omnipaque or Isovue)
Comparison: None.

CLINICAL DATA: Right-sided flank pain, kidney stones suspected

EXAM:
CT ABDOMEN AND PELVIS WITH CONTRAST
TECHNIQUE: Multidetector CT imaging of the abdomen and pelvis was performed
using the standard protocol following bolus administration of
intravenous contrast.
CONTRAST:  100mL OMNIPAQUE IOHEXOL 300 MG/ML  SOLN

[Series 2: axial st · axial · 0.98mm/px · z∈[+720,+1150]mm · 14 of 100 slices shown, 16 images]
[im 7/100  soft-tissue]
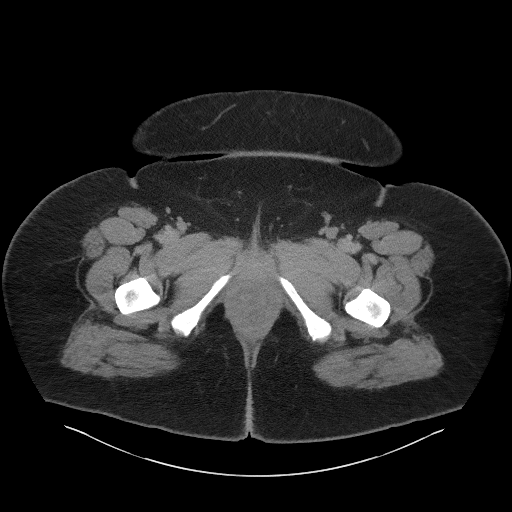
[im 7/100  bone]
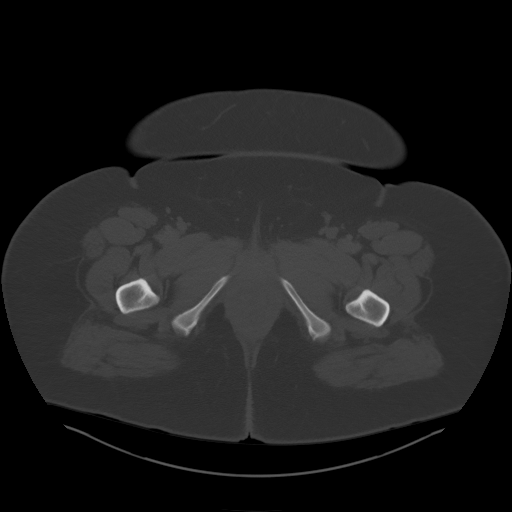
[im 13/100  soft-tissue]
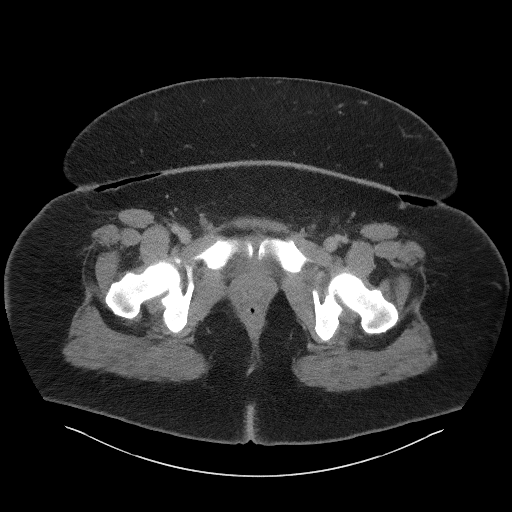
[im 19/100  soft-tissue]
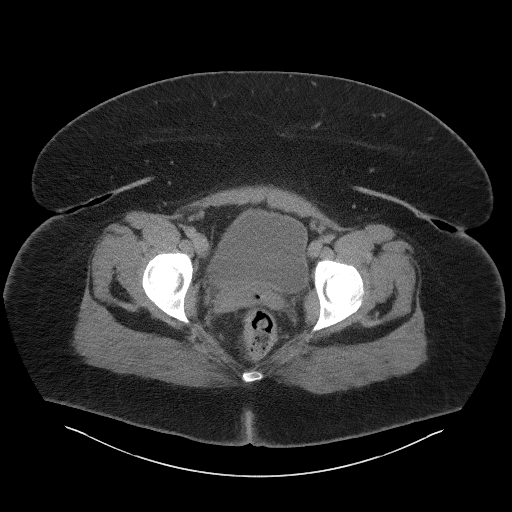
[im 25/100  soft-tissue]
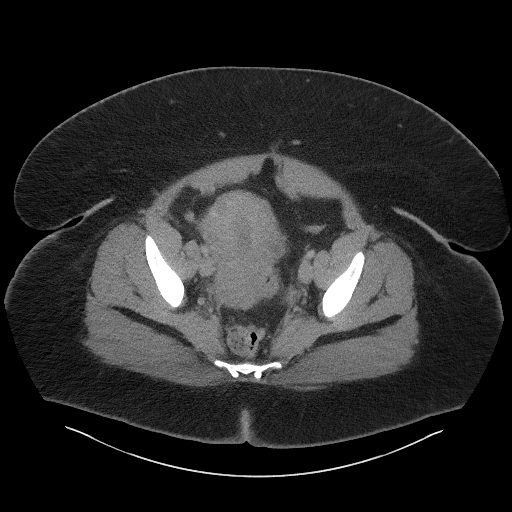
[im 31/100  soft-tissue]
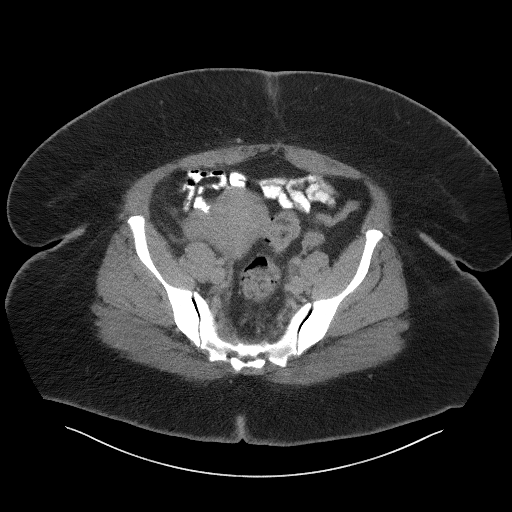
[im 38/100  soft-tissue]
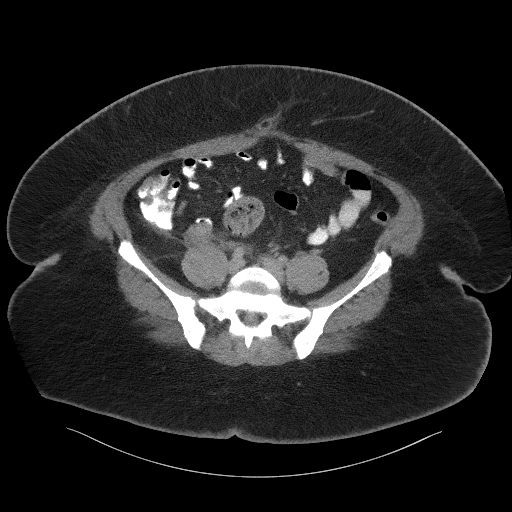
[im 44/100  soft-tissue]
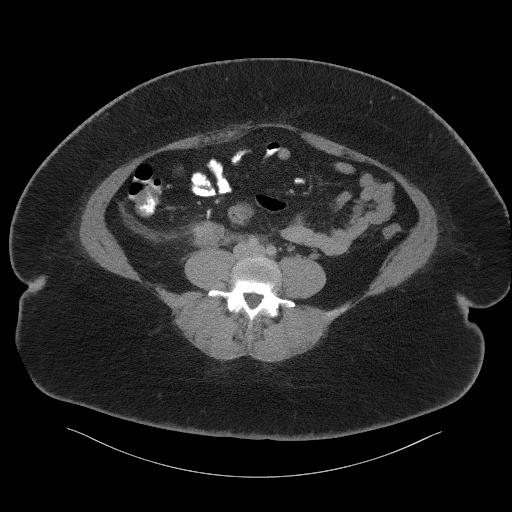
[im 56/100  soft-tissue]
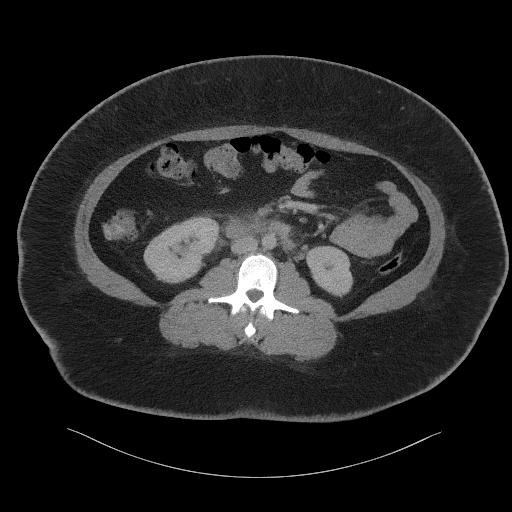
[im 62/100  soft-tissue]
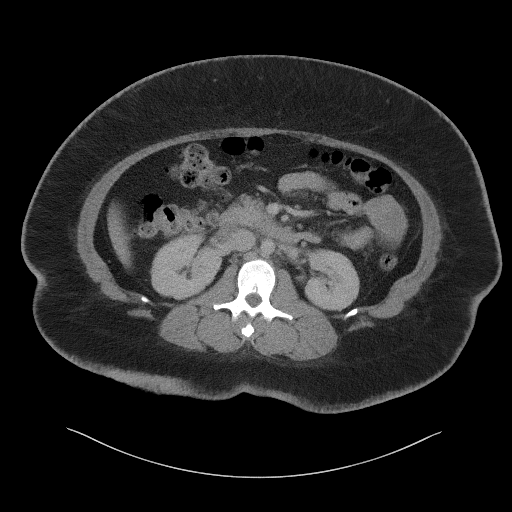
[im 62/100  bone]
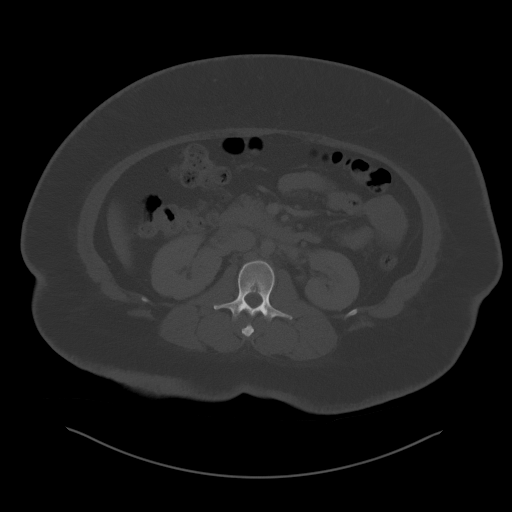
[im 69/100  soft-tissue]
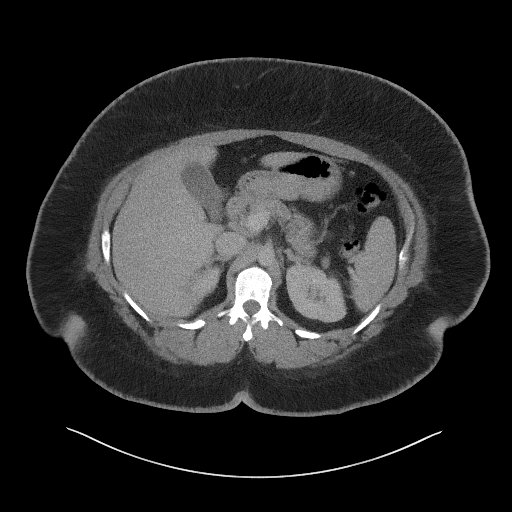
[im 75/100  soft-tissue]
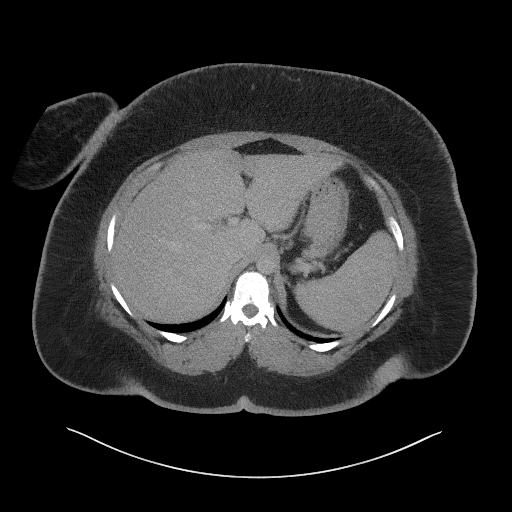
[im 81/100  soft-tissue]
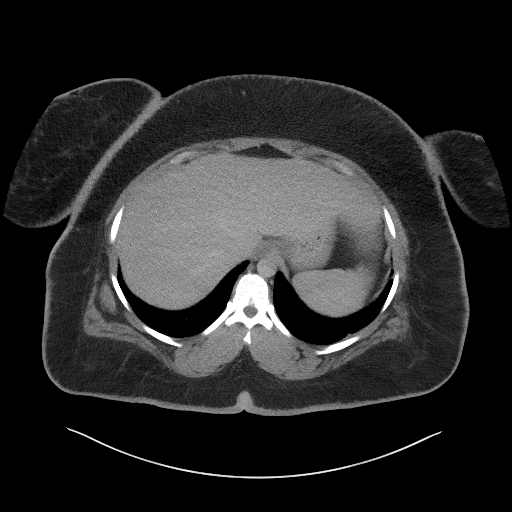
[im 87/100  soft-tissue]
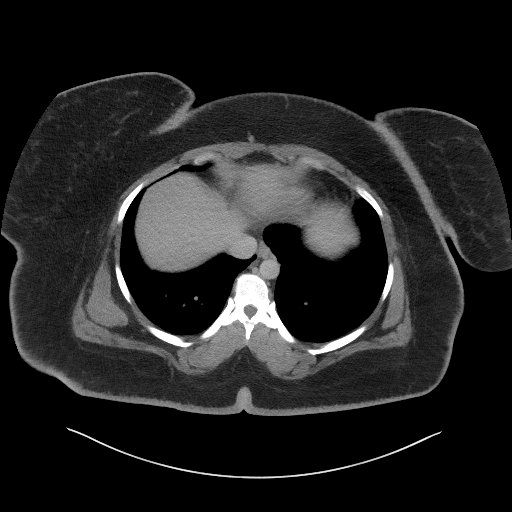
[im 93/100  soft-tissue]
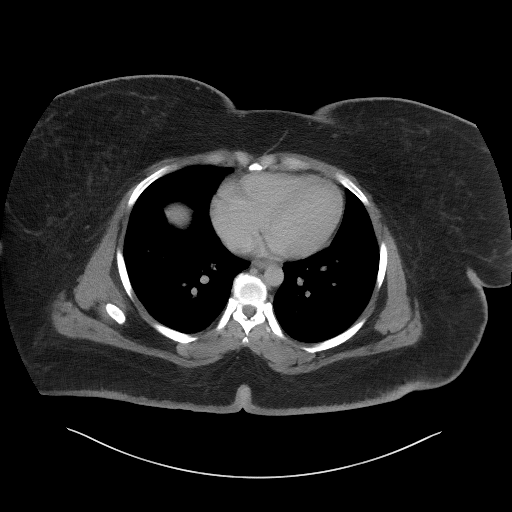

[Series 5: coronal st · coronal · 0.98mm/px · 3 of 123 slices shown]
[im 41/123  soft-tissue]
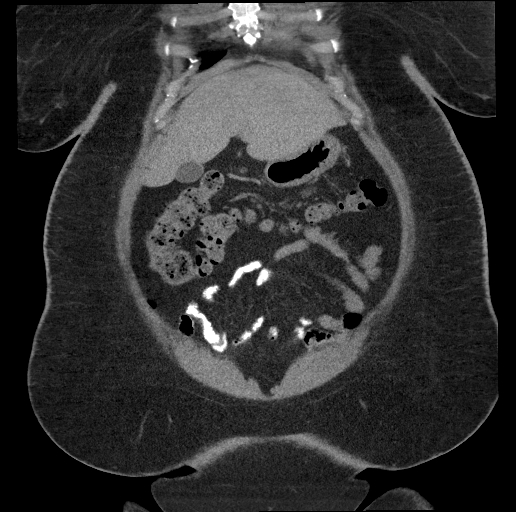
[im 55/123  soft-tissue]
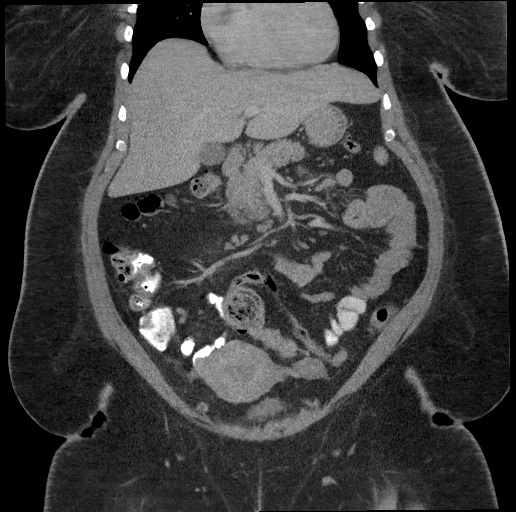
[im 68/123  soft-tissue]
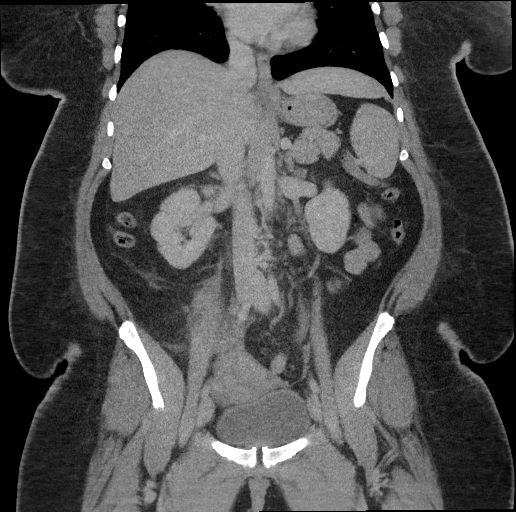

[17 of 46 positions shown; findings below may reference images not displayed]

FINDINGS: Lower chest: No acute abnormality.

Hepatobiliary: No solid liver abnormality is seen. No gallstones,
gallbladder wall thickening, or biliary dilatation.

Pancreas: Unremarkable. No pancreatic ductal dilatation or
surrounding inflammatory changes.

Spleen: Normal in size without significant abnormality.

Adrenals/Urinary Tract: Adrenal glands are unremarkable. Kidneys are
normal, without renal calculi, solid lesion, or hydronephrosis.
Bladder is unremarkable.

Stomach/Bowel: Stomach is within normal limits. Appendix appears
normal. No evidence of bowel wall thickening, distention, or
inflammatory changes.

Vascular/Lymphatic: The right ovarian vein is thickened,
hypoattenuating, and with adjacent fat stranding (series 2, image
50, series 5, image 63). No enlarged abdominal or pelvic lymph
nodes.

Reproductive: No mass or other significant abnormality.

Other: No abdominal wall hernia or abnormality. No abdominopelvic
ascites.

Musculoskeletal: No acute or significant osseous findings.
IMPRESSION: 1. The right ovarian vein is thickened, hypoattenuating, and with
adjacent fat stranding (series 2, image 50, series 5, image 63).
Findings are consistent with ovarian vein thrombosis or
thrombophlebitis.
2. No other CT findings of the abdomen or pelvis to explain pain.

## 2021-01-08 NOTE — L&D Delivery Note (Addendum)
   Delivery Note:   [id number] at [redacted]w[redacted]d  Admitting diagnosis: Encounter for induction of labor [Z34.90] Risks: DM2, well controlled on insulin glargine 10 units at HS and metformin 500 mg BID. A1c 6.2 at NOB labs, rpt 05/2021 was 6.2. Normal fetal echo and no maternal retinopathy. Minimal TWG. Reassuring ANFT weekly after 32 wks, serial growth sono AGA, 32 wks EFW 4'8" (39%) Polyhydramnios since 35 wks, AFI 33 at 36 wks cHTN stable on labetalol 100 mg BID, no PEC sx, stable baseline labs and rpt labs at 35 wks Anemia, hgb 9.8 at 32 wks, IV iron infusion x 2 BMI 58, minimal TWG Depression stable on Wellbutrin 300 XR    First Stage:  Induction of labor:started 1835 07/26/2021 with membrane sweep and buccal Cytotec 50 mcg Onset of labor: 1835 07/26/2021 Augmentation: AROM and Cytotec ROM: 2123 07/26/2021, clear fluid, copious amount Active labor onset: 2130 07/26/2021 Analgesia /Anesthesia/Pain control intrapartum: Epidural   Second Stage:  Complete dilation at 07/27/2021  0105 Onset of pushing at 0105 FHR second stage Category 2, variables   Pushing in R tilt position with CNM and L&D staff support, Greer Pickerel and patient's sister present for birth and supportive. Nuchal Cord: No  Delivery of a Live born female  Birth Weight:  3940 g Weight:, English: 8 lb 11 oz APGAR: 8, 9  Newborn Delivery   Birth date/time: 07/27/2021 01:15:00 Delivery type: Vaginal, Spontaneous      in cephalic presentation, position OA to ROT. Shoulders and body delivered with ease. Newborn with strong cry at birth, vigorous tone, dried and stimulated, placed on maternal abdomen.  Cord double clamped after cessation of pulsation, cut by [facility].  Collection of cord blood for typing completed. Cord blood donation-None  Arterial cord blood sample-No    Third Stage:  Placenta delivered 07/27/2021 at 0121-Spontaneous  with 3 vessels . Uterine tone firm, bleeding minimal. Uterotonics: Pitocin IV bolus Placenta to L&D  for disposal.  None  laceration identified.  Episiotomy:None  Local analgesia: NA  Repair:NA Est. Blood Loss (mL):112.00   Complications: None   Mom to postpartum.  Baby Carisa to Couplet care / Skin to Skin.  Delivery Report:  Review the Delivery Report for details.     Signed: Neta Mends, CNM, MSN 07/27/2021, 1:38 AM

## 2021-01-24 LAB — OB RESULTS CONSOLE GC/CHLAMYDIA
Chlamydia: NEGATIVE
Neisseria Gonorrhea: NEGATIVE

## 2021-01-24 LAB — OB RESULTS CONSOLE RUBELLA ANTIBODY, IGM: Rubella: IMMUNE

## 2021-01-24 LAB — OB RESULTS CONSOLE HIV ANTIBODY (ROUTINE TESTING): HIV: NONREACTIVE

## 2021-01-24 LAB — OB RESULTS CONSOLE HEPATITIS B SURFACE ANTIGEN: Hepatitis B Surface Ag: NEGATIVE

## 2021-05-26 LAB — OB RESULTS CONSOLE RPR: RPR: NONREACTIVE

## 2021-06-27 ENCOUNTER — Other Ambulatory Visit: Payer: Self-pay | Admitting: Obstetrics and Gynecology

## 2021-06-27 DIAGNOSIS — O99013 Anemia complicating pregnancy, third trimester: Secondary | ICD-10-CM

## 2021-07-05 ENCOUNTER — Encounter (HOSPITAL_COMMUNITY): Payer: 59

## 2021-07-10 LAB — OB RESULTS CONSOLE GBS: GBS: NEGATIVE

## 2021-07-17 ENCOUNTER — Non-Acute Institutional Stay (HOSPITAL_COMMUNITY)
Admission: RE | Admit: 2021-07-17 | Discharge: 2021-07-17 | Disposition: A | Discharge status: Home / Self Care | Payer: 59 | Source: Ambulatory Visit | Attending: Internal Medicine | Admitting: Internal Medicine

## 2021-07-17 DIAGNOSIS — O99013 Anemia complicating pregnancy, third trimester: Secondary | ICD-10-CM | POA: Diagnosis present

## 2021-07-17 DIAGNOSIS — D649 Anemia, unspecified: Secondary | ICD-10-CM | POA: Diagnosis not present

## 2021-07-17 MED ORDER — SODIUM CHLORIDE 0.9 % IV SOLN: INTRAVENOUS | Status: DC | PRN

## 2021-07-17 MED ORDER — SODIUM CHLORIDE 0.9 % IV SOLN
500.0000 mg | INTRAVENOUS | Status: DC
  Administered 2021-07-17: 500 mg via INTRAVENOUS
  Filled 2021-07-17: qty 25

## 2021-07-17 MED ORDER — ACETAMINOPHEN 500 MG PO TABS
500.0000 mg | ORAL_TABLET | ORAL | Status: DC
  Administered 2021-07-17: 500 mg via ORAL
  Filled 2021-07-17: qty 1

## 2021-07-17 MED ORDER — DIPHENHYDRAMINE HCL 25 MG PO CAPS
25.0000 mg | ORAL_CAPSULE | ORAL | Status: DC
  Administered 2021-07-17: 25 mg via ORAL
  Filled 2021-07-17: qty 1

## 2021-07-17 NOTE — Progress Notes (Addendum)
PATIENT CARE CENTER NOTE   Diagnosis: Anemia complicating pregnancy in third trimester [O99.013]   Provider:  Carlean Jews, CNM   Procedure: Venofer infusion    Note:  Patient received Venofer 500 mg infusion (dose # 1 of 2) via PIV. Pre-medications given (Tylenol and Benadryl) per order. Patient tolerated infusion well with no adverse reaction. Vital signs stable. BP slightly elevated pre (144/84) and post (140/66) infusion. Per patient, BP has been elevated and she is taking BP medications to address BP.  Discharge instructions given. Patient to come back in two weeks for second infusion and will schedule next appointment at the front desk. Patient alert, oriented and ambulatory at discharge.

## 2021-07-21 ENCOUNTER — Other Ambulatory Visit: Payer: Self-pay

## 2021-07-26 ENCOUNTER — Inpatient Hospital Stay (HOSPITAL_COMMUNITY): Payer: 59 | Admitting: Anesthesiology

## 2021-07-26 ENCOUNTER — Inpatient Hospital Stay (HOSPITAL_COMMUNITY): Admission: AD | Admit: 2021-07-26 | Payer: 59 | Source: Home / Self Care | Admitting: Obstetrics and Gynecology

## 2021-07-26 ENCOUNTER — Other Ambulatory Visit: Payer: Self-pay

## 2021-07-26 ENCOUNTER — Inpatient Hospital Stay (HOSPITAL_COMMUNITY): Payer: 59 | Attending: Obstetrics and Gynecology

## 2021-07-26 ENCOUNTER — Encounter (HOSPITAL_COMMUNITY): Payer: Self-pay

## 2021-07-26 ENCOUNTER — Inpatient Hospital Stay (HOSPITAL_COMMUNITY)
Admission: AD | Admit: 2021-07-26 | Discharge: 2021-07-29 | DRG: 805 | Disposition: A | Discharge status: Home / Self Care | Payer: 59 | Attending: Obstetrics and Gynecology | Admitting: Obstetrics and Gynecology

## 2021-07-26 DIAGNOSIS — Z7984 Long term (current) use of oral hypoglycemic drugs: Secondary | ICD-10-CM

## 2021-07-26 DIAGNOSIS — O403XX0 Polyhydramnios, third trimester, not applicable or unspecified: Secondary | ICD-10-CM | POA: Diagnosis present

## 2021-07-26 DIAGNOSIS — E1165 Type 2 diabetes mellitus with hyperglycemia: Secondary | ICD-10-CM | POA: Diagnosis present

## 2021-07-26 DIAGNOSIS — Z7982 Long term (current) use of aspirin: Secondary | ICD-10-CM

## 2021-07-26 DIAGNOSIS — I10 Essential (primary) hypertension: Secondary | ICD-10-CM | POA: Diagnosis present

## 2021-07-26 DIAGNOSIS — Z3A37 37 weeks gestation of pregnancy: Secondary | ICD-10-CM | POA: Diagnosis not present

## 2021-07-26 DIAGNOSIS — O99344 Other mental disorders complicating childbirth: Secondary | ICD-10-CM | POA: Diagnosis present

## 2021-07-26 DIAGNOSIS — Z794 Long term (current) use of insulin: Secondary | ICD-10-CM

## 2021-07-26 DIAGNOSIS — R609 Edema, unspecified: Secondary | ICD-10-CM | POA: Diagnosis present

## 2021-07-26 DIAGNOSIS — F32A Depression, unspecified: Secondary | ICD-10-CM | POA: Diagnosis present

## 2021-07-26 DIAGNOSIS — O99214 Obesity complicating childbirth: Secondary | ICD-10-CM | POA: Diagnosis present

## 2021-07-26 DIAGNOSIS — O2412 Pre-existing diabetes mellitus, type 2, in childbirth: Principal | ICD-10-CM | POA: Diagnosis present

## 2021-07-26 DIAGNOSIS — O24119 Pre-existing diabetes mellitus, type 2, in pregnancy, unspecified trimester: Secondary | ICD-10-CM | POA: Diagnosis present

## 2021-07-26 DIAGNOSIS — Z3490 Encounter for supervision of normal pregnancy, unspecified, unspecified trimester: Secondary | ICD-10-CM | POA: Diagnosis present

## 2021-07-26 DIAGNOSIS — O1002 Pre-existing essential hypertension complicating childbirth: Secondary | ICD-10-CM | POA: Diagnosis present

## 2021-07-26 DIAGNOSIS — O10919 Unspecified pre-existing hypertension complicating pregnancy, unspecified trimester: Secondary | ICD-10-CM | POA: Diagnosis present

## 2021-07-26 DIAGNOSIS — O9902 Anemia complicating childbirth: Secondary | ICD-10-CM | POA: Diagnosis present

## 2021-07-26 LAB — TYPE AND SCREEN
ABO/RH(D): O POS
Antibody Screen: NEGATIVE

## 2021-07-26 LAB — COMPREHENSIVE METABOLIC PANEL
ALT: 14 U/L (ref 0–44)
AST: 23 U/L (ref 15–41)
Albumin: 2.6 g/dL — ABNORMAL LOW (ref 3.5–5.0)
Alkaline Phosphatase: 98 U/L (ref 38–126)
Anion gap: 10 (ref 5–15)
BUN: <5 mg/dL — ABNORMAL LOW (ref 6–20)
CO2: 19 mmol/L — ABNORMAL LOW (ref 22–32)
Calcium: 9.4 mg/dL (ref 8.9–10.3)
Chloride: 109 mmol/L (ref 98–111)
Creatinine, Ser: 0.62 mg/dL (ref 0.44–1.00)
GFR, Estimated: >60 mL/min (ref >60)
Glucose, Bld: 96 mg/dL (ref 70–99)
Potassium: 3.6 mmol/L (ref 3.5–5.1)
Sodium: 138 mmol/L (ref 135–145)
Total Bilirubin: 0.6 mg/dL (ref 0.3–1.2)
Total Protein: 5.8 g/dL — ABNORMAL LOW (ref 6.5–8.1)

## 2021-07-26 LAB — CBC
HCT: 30.9 % — ABNORMAL LOW (ref 36.0–46.0)
Hemoglobin: 10.3 g/dL — ABNORMAL LOW (ref 12.0–15.0)
MCH: 28.3 pg (ref 26.0–34.0)
MCHC: 33.3 g/dL (ref 30.0–36.0)
MCV: 84.9 fL (ref 80.0–100.0)
Platelets: 162 10*3/uL (ref 150–400)
RBC: 3.64 MIL/uL — ABNORMAL LOW (ref 3.87–5.11)
RDW: 16.7 % — ABNORMAL HIGH (ref 11.5–15.5)
WBC: 7.6 10*3/uL (ref 4.0–10.5)
nRBC: 0 % (ref 0.0–0.2)

## 2021-07-26 MED ORDER — MISOPROSTOL 50MCG HALF TABLET
50.0000 ug | ORAL_TABLET | ORAL | Status: DC | PRN
  Administered 2021-07-26: 50 ug via BUCCAL
  Filled 2021-07-26: qty 1

## 2021-07-26 MED ORDER — METFORMIN HCL 500 MG PO TABS
500.0000 mg | ORAL_TABLET | Freq: Two times a day (BID) | ORAL | Status: DC
  Administered 2021-07-27 – 2021-07-29 (×5): 500 mg via ORAL
  Filled 2021-07-26 (×6): qty 1

## 2021-07-26 MED ORDER — OXYTOCIN-SODIUM CHLORIDE 30-0.9 UT/500ML-% IV SOLN
INTRAVENOUS | Status: AC
  Filled 2021-07-26: qty 500

## 2021-07-26 MED ORDER — LIDOCAINE HCL (PF) 1 % IJ SOLN
INTRAMUSCULAR | Status: DC | PRN
  Administered 2021-07-26: 5 mL via EPIDURAL
  Administered 2021-07-26: 2 mL via EPIDURAL
  Administered 2021-07-26: 3 mL via EPIDURAL

## 2021-07-26 MED ORDER — TERBUTALINE SULFATE 1 MG/ML IJ SOLN: 0.2500 mg | Freq: Once | INTRAMUSCULAR | Status: DC | PRN

## 2021-07-26 MED ORDER — PHENYLEPHRINE 80 MCG/ML (10ML) SYRINGE FOR IV PUSH (FOR BLOOD PRESSURE SUPPORT)
80.0000 ug | PREFILLED_SYRINGE | INTRAVENOUS | Status: DC | PRN
  Filled 2021-07-26: qty 10

## 2021-07-26 MED ORDER — EPHEDRINE 5 MG/ML INJ: 10.0000 mg | INTRAVENOUS | Status: DC | PRN

## 2021-07-26 MED ORDER — LACTATED RINGERS IV SOLN: 500.0000 mL | Freq: Once | INTRAVENOUS | Status: DC

## 2021-07-26 MED ORDER — LACTATED RINGERS IV SOLN
500.0000 mL | Freq: Once | INTRAVENOUS | Status: AC
Start: 2021-07-26 — End: 2021-07-26
  Administered 2021-07-26: 500 mL via INTRAVENOUS

## 2021-07-26 MED ORDER — INSULIN ASPART 100 UNIT/ML IJ SOLN: 0.0000 [IU] | INTRAMUSCULAR | Status: DC

## 2021-07-26 MED ORDER — ACETAMINOPHEN 325 MG PO TABS: 650.0000 mg | ORAL_TABLET | ORAL | Status: DC | PRN

## 2021-07-26 MED ORDER — LABETALOL HCL 100 MG PO TABS
100.0000 mg | ORAL_TABLET | Freq: Two times a day (BID) | ORAL | Status: DC
  Administered 2021-07-26 – 2021-07-28 (×4): 100 mg via ORAL
  Filled 2021-07-26 (×4): qty 1

## 2021-07-26 MED ORDER — FENTANYL-BUPIVACAINE-NACL 0.5-0.125-0.9 MG/250ML-% EP SOLN
12.0000 mL/h | EPIDURAL | Status: DC | PRN
  Administered 2021-07-26: 12 mL/h via EPIDURAL
  Filled 2021-07-26: qty 250

## 2021-07-26 MED ORDER — INSULIN GLARGINE-YFGN 100 UNIT/ML ~~LOC~~ SOLN
10.0000 [IU] | Freq: Every day | SUBCUTANEOUS | Status: DC
  Filled 2021-07-26: qty 0.1

## 2021-07-26 MED ORDER — OXYTOCIN 10 UNIT/ML IJ SOLN: 10.0000 [IU] | Freq: Once | INTRAMUSCULAR | Status: DC

## 2021-07-26 MED ORDER — LIDOCAINE HCL (PF) 1 % IJ SOLN: 30.0000 mL | INTRAMUSCULAR | Status: DC | PRN

## 2021-07-26 MED ORDER — BUPROPION HCL ER (XL) 300 MG PO TB24
300.0000 mg | ORAL_TABLET | Freq: Every day | ORAL | Status: DC
  Administered 2021-07-27 – 2021-07-29 (×3): 300 mg via ORAL
  Filled 2021-07-26 (×3): qty 1

## 2021-07-26 MED ORDER — SOD CITRATE-CITRIC ACID 500-334 MG/5ML PO SOLN: 30.0000 mL | ORAL | Status: DC | PRN

## 2021-07-26 MED ORDER — PHENYLEPHRINE 80 MCG/ML (10ML) SYRINGE FOR IV PUSH (FOR BLOOD PRESSURE SUPPORT): 80.0000 ug | PREFILLED_SYRINGE | INTRAVENOUS | Status: DC | PRN

## 2021-07-26 MED ORDER — DIPHENHYDRAMINE HCL 50 MG/ML IJ SOLN: 12.5000 mg | INTRAMUSCULAR | Status: DC | PRN

## 2021-07-26 NOTE — H&P (Signed)
OB ADMISSION/ HISTORY & PHYSICAL:  Admission Date: 07/26/2021  6:03 PM  Admit Diagnosis: Encounter for induction of labor [Z34.90]    Natalie [name] is a 36 y.o. female presenting for IOL 2/2 poly, cHTN, DM2, BMI 58. Feels well, denies PEC s/sx, spouse Greer Pickerel present and supportive.  Hx NSVB x 5, uncomplicated, usually no epidurals, desires to have one this time. Does not plan to breastfeed, has always used formula.  Expecting a baby girl, undecided name.    Prenatal History: [id number]   EDC : 08/13/2021, by Other Basis  Prenatal care at [facility] since 11 wks, primary Renae Fickle, CNM, co-managed w/ Dr. Juliene Pina.   Prenatal course complicated by: DM2, well controlled on insulin glargine 10 units at HS and metformin 500 mg BID. A1c 6.2 at NOB labs, rpt 05/2021 was 6.2. Normal fetal echo and no maternal retinopathy. Minimal TWG. Reassuring ANFT weekly after 32 wks, serial growth sono AGA, 32 wks EFW 4'8" (39%) Polyhydramnios since 35 wks, AFI 33 at 36 wks cHTN stable on labetalol 100 mg BID, no PEC sx, stable baseline labs and rpt labs at 35 wks Anemia, hgb 9.8 at 32 wks, IV iron infusion x 2 BMI 58, minimal TWG Depression stable on Wellbutrin 300 XR  Prenatal Labs: ABO, Rh:   O pos Antibody: neg Rubella: Immune (01/17 0000)  RPR: Nonreactive (05/19 0000)  HBsAg: Negative (01/17 0000)  HIV: Non-reactive (01/17 0000)  GBS: Negative/-- (07/03 0000)  Genetic Screening: NIPS LR XX Ultrasound: normal XX anatomy, anterior placenta, AGA  Pelvis proven 8'11"  Vaccines: TDaP          UTD       Medical / Surgical History :  Past medical history:  Past Medical History:  Diagnosis Date   Depression    Gastroenteritis, acute 01/24/2015   Gestational diabetes    metformin   Gestational diabetes mellitus, delivered 11/28/2012   Headache(784.0)    Hx of varicella    Infection    UTI   Morbid obesity (HCC)    Pregnancy induced hypertension      Past surgical history:  Past  Surgical History:  Procedure Laterality Date   NO PAST SURGERIES       Family History:  Family History  Problem Relation Age of Onset   Hypothyroidism Mother    Asthma Mother    Bipolar disorder Mother    Diabetes Mother    Stroke Father    Hypertension Father    Seizures Father    Cancer Father    Asthma Sister    Asthma Brother    Asthma Daughter    Diabetes Maternal Uncle    Diabetes Maternal Grandmother    Cancer Maternal Grandmother        breast   Heart disease Paternal Grandfather    Cancer Paternal Grandfather    Heart attack Paternal Grandfather    Cancer Maternal Aunt      Social History:  reports that she has never smoked. She has never used smokeless tobacco. She reports that she does not drink alcohol and does not use drugs.  Allergies: Patient has no known allergies.   Current Medications at time of admission:  Medications Prior to Admission  Medication Sig Dispense Refill Last Dose   aspirin 81 MG chewable tablet Chew 81 mg by mouth daily.      folic acid (FOLVITE) 0.5 MG tablet Take 1 mg by mouth daily.      Insulin Glargine (BASAGLAR  KWIKPEN) 100 UNIT/ML Inject 10 Units into the skin at bedtime.      labetalol (NORMODYNE) 100 MG tablet Take 100 mg by mouth 2 (two) times daily.      metFORMIN (GLUCOPHAGE) 500 MG tablet Take 500 mg by mouth 2 (two) times daily with a meal.      buPROPion (WELLBUTRIN XL) 150 MG 24 hr tablet Take 300 mg by mouth daily.        Review of Systems: ROS As noted above.   Physical Exam: Vital signs and nursing notes reviewed.  Patient Vitals for the past 24 hrs:  BP Temp Temp src Pulse Resp Height Weight  07/26/21 1929 (!) 145/81 -- -- (!) 125 20 -- --  07/26/21 1853 (!) 170/93 -- -- (!) 124 20 -- --  07/26/21 1839 (!) 168/92 99.4 F (37.4 C) Oral (!) 118 20 5\' 3"  (1.6 m) (!) 156.7 kg     General: AAO x 3, NAD Heart: RRR Lungs:CTAB Abdomen: Gravid, NT, obese Extremities: ++ edema Genitalia / VE: Dilation:  4.5 Effacement (%): 50 Station: Ballotable Presentation: Vertex Exam by:: CNM Armstrong Creasy   FHR: 150 BPM, moderate variability, +  accels, no decels TOCO: Ctx occasional  Labs:   Pending T&S, CBC, RPR  Recent Labs    07/26/21 1851  WBC 7.6  HGB 10.3*  HCT 30.9*  PLT 162  CMP     Component Value Date/Time   NA 138 07/26/2021 1945   K 3.6 07/26/2021 1945   CL 109 07/26/2021 1945   CO2 19 (L) 07/26/2021 1945   GLUCOSE 96 07/26/2021 1945   BUN <5 (L) 07/26/2021 1945   CREATININE 0.62 07/26/2021 1945   CALCIUM 9.4 07/26/2021 1945   PROT 5.8 (L) 07/26/2021 1945   ALBUMIN 2.6 (L) 07/26/2021 1945   AST 23 07/26/2021 1945   ALT 14 07/26/2021 1945   ALKPHOS 98 07/26/2021 1945   BILITOT 0.6 07/26/2021 1945   GFRNONAA >60 07/26/2021 1945   GFRAA >60 12/20/2018 1205       Assessment/Plan:  35 y.o. 14/12/2018 at [redacted]w[redacted]d for IOL cHTN, elevated BP on admit, suspect anxious and did not tale labetalol today Will give dose of labetalol and monitor BP, antihypertensive protocol PRN, CMP pending 2. DM2, continue insulin glargine 10 units at HS and metformin 500 mg BID Endo tool for [facility], diabetes coordinator consult. Freestyle Libre monitor for CBG's 3. Polyhydramnios - risk for cord prolapse, plan controlled AROM with cephalic engagement 4. Anemia stable, hgb on admit 10.3, plan AMTSL 5. Depression - continue Wellbutrin 300 XR daily, monitor closely PP  Fetal wellbeing - FHT category 1 EFW 7.5 lbs, AGA  Labor: IOL with PO cytotec and membrane sweep Pitocin augmentation PRN  GBS neg Rubella imm Rh pos  Pain control: desires epidural in active labor Analgesia/anesthesia PRN  Anticipated MOD: NSVB  Plans to bottlefeed POC discussed with patient and support team, all questions answered.  Dr [facility] notified of admission / plan of care   Ernestina Penna CNM, MSN 07/26/2021, 7:21 PM

## 2021-07-26 NOTE — Anesthesia Preprocedure Evaluation (Signed)
Anesthesia Evaluation  Patient identified by MRN, date of birth, ID band Patient awake    Reviewed: Allergy & Precautions, NPO status , Patient's Chart, lab work & pertinent test results, reviewed documented beta blocker date and time   Airway Mallampati: II  TM Distance: >3 FB Neck ROM: Full    Dental  (+) Teeth Intact, Dental Advisory Given   Pulmonary neg pulmonary ROS,    Pulmonary exam normal breath sounds clear to auscultation       Cardiovascular hypertension, Pt. on home beta blockers Normal cardiovascular exam Rhythm:Regular Rate:Normal     Neuro/Psych  Headaches, PSYCHIATRIC DISORDERS Depression    GI/Hepatic negative GI ROS, Neg liver ROS,   Endo/Other  diabetes, Gestational, Oral Hypoglycemic Agents, Insulin DependentMorbid obesity  Renal/GU negative Renal ROS     Musculoskeletal negative musculoskeletal ROS (+)   Abdominal   Peds  Hematology  (+) Blood dyscrasia, anemia , Plt 162k   Anesthesia Other Findings Day of surgery medications reviewed with the patient.  Reproductive/Obstetrics                             Anesthesia Physical Anesthesia Plan  ASA: 4  Anesthesia Plan: Epidural   Post-op Pain Management:    Induction:   PONV Risk Score and Plan: 2 and Treatment may vary due to age or medical condition  Airway Management Planned: Natural Airway  Additional Equipment:   Intra-op Plan:   Post-operative Plan:   Informed Consent: I have reviewed the patients History and Physical, chart, labs and discussed the procedure including the risks, benefits and alternatives for the proposed anesthesia with the patient or authorized representative who has indicated his/her understanding and acceptance.     Dental advisory given  Plan Discussed with:   Anesthesia Plan Comments: (Patient identified. Risks/Benefits/Options discussed with patient including but not limited  to bleeding, infection, nerve damage, paralysis, failed block, incomplete pain control, headache, blood pressure changes, nausea, vomiting, reactions to medication both or allergic, itching and postpartum back pain. Confirmed with bedside nurse the patient's most recent platelet count. Confirmed with patient that they are not currently taking any anticoagulation, have any bleeding history or any family history of bleeding disorders. Patient expressed understanding and wished to proceed. All questions were answered. )        Anesthesia Quick Evaluation

## 2021-07-26 NOTE — Anesthesia Procedure Notes (Signed)
Epidural Patient location during procedure: OB Start time: 07/26/2021 9:53 PM End time: 07/26/2021 10:00 PM  Staffing Anesthesiologist: Collene Schlichter, MD Performed: anesthesiologist   Preanesthetic Checklist Completed: patient identified, IV checked, risks and benefits discussed, monitors and equipment checked, pre-op evaluation and timeout performed  Epidural Patient position: sitting Prep: DuraPrep Patient monitoring: blood pressure and continuous pulse ox Approach: midline Location: L3-L4 Injection technique: LOR air  Needle:  Needle type: Tuohy  Needle gauge: 17 G Needle length: 9 cm Needle insertion depth: 9 cm Catheter size: 19 Gauge Catheter at skin depth: 14 cm Test dose: negative and Other (1% Lidocaine)  Additional Notes Patient identified.  Risk benefits discussed including failed block, incomplete pain control, headache, nerve damage, paralysis, blood pressure changes, nausea, vomiting, reactions to medication both toxic or allergic, and postpartum back pain.  Patient expressed understanding and wished to proceed.  All questions were answered.  Sterile technique used throughout procedure and epidural site dressed with sterile barrier dressing. No paresthesia or other complications noted. The patient did not experience any signs of intravascular injection such as tinnitus or metallic taste in mouth nor signs of intrathecal spread such as rapid motor block. Please see nursing notes for vital signs. Reason for block:procedure for pain

## 2021-07-26 NOTE — Progress Notes (Signed)
                                                                                                                                                                                                                                                                                                                       Natalie [name] is a 36 y.o. G6P5005 at [redacted]w[redacted]d admitted for Encounter for induction of labor [Z34.90] cHTN, DM2, poly, BMI 58  Subjective: Resting in bed, s/p epidural placement, effective pain relief.  Spouse Coy and friend at [facility] and supportive.   Objective: Vitals:   07/26/21 2221 07/26/21 2225 07/26/21 2231 07/26/21 2235  BP: 134/79 136/85 (!) 141/87 137/86  Pulse: (!) 102 (!) 102 (!) 102 (!) 108  Resp: 19 18 20    Temp:      TempSrc:      Weight:      Height:          FHT:  FHR: 150 bpm, variability: moderate,  accelerations:  Present,  decelerations:  Absent UC:   regular, every 2-3 minutes SVE:   Dilation: 4.5 Effacement (%): 50 Station: -2 Exam by:: [email or web address], CNM AROM with exam, controlled fluid release, clear fluid.  Vertex engaging well during contractions.   Labs:   CBG: 102, 72  Assessment / Plan: Colon Flattery 36 y.o. [redacted]w[redacted]d Induction of labor due to [facility] medical conditions, progressing well with one dose of cytotec and now AROM  cHTN - BP now normal range, noe PEC s/sx, labs stable DM2 - CBG stable Labor:  anticipate physiologic labor after AROM, good contraction pattern, will continue expectant management for now, Pitocin PRN Preeclampsia:  no signs or symptoms of toxicity and labs stable Fetal Wellbeing:  Category I, FSE applied for CEFM, body habitus not conducive to external monitors Pain Control:  Epidural I/D:    GBS neg  Anticipated MOD:  NSVD  [facility], CNM, MSN 07/26/2021, 10:37 PM

## 2021-07-27 ENCOUNTER — Encounter (HOSPITAL_COMMUNITY): Payer: Self-pay

## 2021-07-27 DIAGNOSIS — F32A Depression, unspecified: Secondary | ICD-10-CM | POA: Diagnosis present

## 2021-07-27 LAB — CBC
HCT: 31.3 % — ABNORMAL LOW (ref 36.0–46.0)
HCT: 30.6 % — ABNORMAL LOW (ref 36.0–46.0)
Hemoglobin: 10.4 g/dL — ABNORMAL LOW (ref 12.0–15.0)
Hemoglobin: 10.2 g/dL — ABNORMAL LOW (ref 12.0–15.0)
MCH: 28.5 pg (ref 26.0–34.0)
MCH: 28.4 pg (ref 26.0–34.0)
MCHC: 33.2 g/dL (ref 30.0–36.0)
MCHC: 33.3 g/dL (ref 30.0–36.0)
MCV: 85.8 fL (ref 80.0–100.0)
MCV: 85.2 fL (ref 80.0–100.0)
Platelets: 161 10*3/uL (ref 150–400)
Platelets: 162 10*3/uL (ref 150–400)
RBC: 3.65 MIL/uL — ABNORMAL LOW (ref 3.87–5.11)
RBC: 3.59 MIL/uL — ABNORMAL LOW (ref 3.87–5.11)
RDW: 17.2 % — ABNORMAL HIGH (ref 11.5–15.5)
RDW: 16.8 % — ABNORMAL HIGH (ref 11.5–15.5)
WBC: 9.2 10*3/uL (ref 4.0–10.5)
WBC: 10 10*3/uL (ref 4.0–10.5)
nRBC: 0.3 % — ABNORMAL HIGH (ref 0.0–0.2)
nRBC: 0 % (ref 0.0–0.2)

## 2021-07-27 LAB — RPR: RPR Ser Ql: NONREACTIVE

## 2021-07-27 MED ORDER — ONDANSETRON HCL 4 MG PO TABS: 4.0000 mg | ORAL_TABLET | ORAL | Status: DC | PRN

## 2021-07-27 MED ORDER — ACETAMINOPHEN 500 MG PO TABS
1000.0000 mg | ORAL_TABLET | Freq: Four times a day (QID) | ORAL | Status: DC
  Administered 2021-07-27 – 2021-07-29 (×8): 1000 mg via ORAL
  Filled 2021-07-27 (×8): qty 2

## 2021-07-27 MED ORDER — DIBUCAINE (PERIANAL) 1 % EX OINT: 1.0000 | TOPICAL_OINTMENT | CUTANEOUS | Status: DC | PRN

## 2021-07-27 MED ORDER — WITCH HAZEL-GLYCERIN EX PADS: 1.0000 | MEDICATED_PAD | CUTANEOUS | Status: DC | PRN

## 2021-07-27 MED ORDER — OXYCODONE HCL 5 MG PO TABS
5.0000 mg | ORAL_TABLET | Freq: Once | ORAL | Status: AC
  Administered 2021-07-27: 5 mg via ORAL
  Filled 2021-07-27: qty 1

## 2021-07-27 MED ORDER — BENZOCAINE-MENTHOL 20-0.5 % EX AERO: 1.0000 | INHALATION_SPRAY | CUTANEOUS | Status: DC | PRN

## 2021-07-27 MED ORDER — SIMETHICONE 80 MG PO CHEW: 80.0000 mg | CHEWABLE_TABLET | ORAL | Status: DC | PRN

## 2021-07-27 MED ORDER — DIPHENHYDRAMINE HCL 25 MG PO CAPS: 25.0000 mg | ORAL_CAPSULE | Freq: Four times a day (QID) | ORAL | Status: DC | PRN

## 2021-07-27 MED ORDER — NIFEDIPINE ER OSMOTIC RELEASE 30 MG PO TB24
30.0000 mg | ORAL_TABLET | Freq: Every day | ORAL | Status: DC
  Administered 2021-07-27 – 2021-07-28 (×2): 30 mg via ORAL
  Filled 2021-07-27 (×2): qty 1

## 2021-07-27 MED ORDER — PRENATAL MULTIVITAMIN CH: 1.0000 | ORAL_TABLET | Freq: Every day | ORAL | Status: DC

## 2021-07-27 MED ORDER — BISACODYL 10 MG RE SUPP: 10.0000 mg | Freq: Every day | RECTAL | Status: DC | PRN

## 2021-07-27 MED ORDER — FLEET ENEMA 7-19 GM/118ML RE ENEM: 1.0000 | ENEMA | Freq: Every day | RECTAL | Status: DC | PRN

## 2021-07-27 MED ORDER — FUROSEMIDE 20 MG PO TABS
10.0000 mg | ORAL_TABLET | Freq: Every day | ORAL | Status: DC
  Administered 2021-07-27 – 2021-07-29 (×3): 10 mg via ORAL
  Filled 2021-07-27 (×3): qty 1

## 2021-07-27 MED ORDER — TETANUS-DIPHTH-ACELL PERTUSSIS 5-2.5-18.5 LF-MCG/0.5 IM SUSY: 0.5000 mL | PREFILLED_SYRINGE | Freq: Once | INTRAMUSCULAR | Status: DC

## 2021-07-27 MED ORDER — ONDANSETRON HCL 4 MG/2ML IJ SOLN: 4.0000 mg | INTRAMUSCULAR | Status: DC | PRN

## 2021-07-27 MED ORDER — COCONUT OIL OIL: 1.0000 | TOPICAL_OIL | Status: DC | PRN

## 2021-07-27 MED ORDER — SENNOSIDES-DOCUSATE SODIUM 8.6-50 MG PO TABS
2.0000 | ORAL_TABLET | ORAL | Status: DC
  Administered 2021-07-27 – 2021-07-29 (×3): 2 via ORAL
  Filled 2021-07-27 (×3): qty 2

## 2021-07-27 MED ORDER — ZOLPIDEM TARTRATE 5 MG PO TABS: 5.0000 mg | ORAL_TABLET | Freq: Every evening | ORAL | Status: DC | PRN

## 2021-07-27 MED ORDER — IBUPROFEN 600 MG PO TABS
600.0000 mg | ORAL_TABLET | Freq: Four times a day (QID) | ORAL | Status: DC
  Administered 2021-07-27 – 2021-07-29 (×8): 600 mg via ORAL
  Filled 2021-07-27 (×9): qty 1

## 2021-07-27 NOTE — Progress Notes (Signed)
8 am glucose on pt's device 103

## 2021-07-27 NOTE — Social Work (Signed)
MOB was referred for history of depression/anxiety. * Referral screened out by Clinical Social Worker because none of the following criteria appear to apply:  ~ History of anxiety/depression during this pregnancy, or of post-partum depression following prior delivery.  ~ Diagnosis of anxiety and/or depression within last 3 years OR * MOB's symptoms currently being treated with medication and/or therapy.  Per chart review MOB is stable on Wellbutrin. MOB's mood was accessed throughout pregnancy while on medication and MOB reported stable mood.  Please contact the Clinical Social Worker if needs arise, by [facility] request, or if MOB scores greater than 9/yes to question 10 on [place] Postpartum Depression Screen.  Wende Neighbors Clinical Social Worker [phone]

## 2021-07-27 NOTE — Anesthesia Postprocedure Evaluation (Signed)
Anesthesia Post Note  Patient: Natalie [name]  Procedure(s) Performed: AN AD HOC LABOR EPIDURAL     Patient location during evaluation: Mother Baby Anesthesia Type: Epidural Level of consciousness: awake and alert and oriented Pain management: satisfactory to patient Vital Signs Assessment: post-procedure vital signs reviewed and stable Respiratory status: respiratory function stable Cardiovascular status: stable Postop Assessment: no headache, no backache, epidural receding, patient able to bend at knees, no signs of nausea or vomiting, adequate PO intake and able to ambulate Anesthetic complications: no   No notable events documented.  Last Vitals:  Vitals:   07/27/21 0932 07/27/21 1316  BP: (!) 144/94 (!) 147/89  Pulse: (!) 108 (!) 103  Resp: 19 19  Temp: 36.7 C 37 C  SpO2: 97% 100%    Last Pain:  Vitals:   07/27/21 1316  TempSrc:   PainSc: 0-No pain   Pain Goal: Patients Stated Pain Goal: 0 (07/27/21 0004)                 Karleen Dolphin

## 2021-07-27 NOTE — Progress Notes (Signed)
Patient Vitals for the past 24 hrs:  BP Temp Temp src Pulse Resp SpO2  07/27/21 1943 (!) 151/100 99.4 F (37.4 C) Oral 95 18 100 %  07/27/21 1316 (!) 147/89 98.6 F (37 C) -- (!) 103 19 100 %  07/27/21 0932 (!) 144/94 98 F (36.7 C) -- (!) 108 19 97 %  07/27/21 0454 (!) 153/99 -- Oral (!) 113 20 99 %  07/27/21 0350 (!) 150/90 98.9 F (37.2 C) Oral (!) 112 18 99 %  07/27/21 0148 (!) 189/165 -- -- (!) 128 20 --  07/27/21 0100 138/89 -- -- (!) 128 20 --  07/27/21 0002 (!) 130/55 -- -- (!) 111 20 --  07/26/21 2300 121/64 -- -- (!) 105 20 --  07/26/21 2235 137/86 -- -- (!) 108 20 --  07/26/21 2231 (!) 141/87 98.2 F (36.8 C) Oral (!) 102 20 --  07/26/21 2225 136/85 -- -- (!) 102 18 --  07/26/21 2221 134/79 -- -- (!) 102 19 --  07/26/21 2216 128/76 -- -- (!) 106 19 --  07/26/21 2210 (!) 149/85 -- -- (!) 109 -- --  07/26/21 2205 (!) 140/111 -- -- (!) 122 -- --  07/26/21 2130 (!) 149/97 -- -- (!) 113 20 --    On Labetalol 100mg  bid for CHTN Needs to start Procardia 30mg  XL

## 2021-07-28 MED ORDER — LABETALOL HCL 100 MG PO TABS
300.0000 mg | ORAL_TABLET | Freq: Two times a day (BID) | ORAL | Status: DC
Start: 2021-07-28 — End: 2021-07-28

## 2021-07-28 MED ORDER — NIFEDIPINE ER OSMOTIC RELEASE 30 MG PO TB24
30.0000 mg | ORAL_TABLET | Freq: Two times a day (BID) | ORAL | Status: DC
  Administered 2021-07-28 – 2021-07-29 (×2): 30 mg via ORAL
  Filled 2021-07-28 (×2): qty 1

## 2021-07-28 MED ORDER — LABETALOL HCL 200 MG PO TABS
200.0000 mg | ORAL_TABLET | Freq: Two times a day (BID) | ORAL | Status: DC
  Administered 2021-07-28 – 2021-07-29 (×2): 200 mg via ORAL
  Filled 2021-07-28 (×2): qty 1

## 2021-07-28 NOTE — Progress Notes (Signed)
Post Partum Day 1, SVD, IOL at 37.4 wks for [facility] and poorly controlled DM II , AMA Girl, 8'11" Formula feeds  Subjective: Light HA. NO CP SOB. LE swelling better  Lochia minimal, no perineal pain. Wants to go home but understands my concerns about her BPs   Objective: BP (!) 143/91 (BP Location: Left Arm)   Pulse 96   Temp 97.6 F (36.4 C) (Oral)   Resp 18   Ht 5\' 3"  (1.6 m)   Wt (!) 149.6 kg   SpO2 100%   Breastfeeding Unknown   BMI 58.42 kg/m   Weight loss noted 156.7 kg on 07/26/21 now 149.6  kg ---> 7 kg loss   Patient Vitals for the past 24 hrs:  BP Temp Temp src Pulse Resp SpO2 Weight  07/28/21 1046 -- -- -- -- -- -- (!) 149.6 kg  07/28/21 0439 (!) 143/91 97.6 F (36.4 C) Oral 96 18 100 % --  07/27/21 1943 (!) 151/100 99.4 F (37.4 C) Oral 95 18 100 % --  07/27/21 1316 (!) 147/89 98.6 F (37 C) -- (!) 103 19 100 % --      Physical Exam:  General: alert and cooperative Lungs CTA bl CV RRR  Lochia: appropriate Uterine Fundus: firm Incision: healing well DVT Evaluation: No evidence of DVT seen on physical exam. Negative Homan's sign.     Latest Ref Rng & Units 07/27/2021    5:42 AM 07/27/2021    2:44 AM 07/26/2021    6:51 PM  CBC  WBC 4.0 - 10.5 K/uL 10.0  9.2  7.6   Hemoglobin 12.0 - 15.0 g/dL 07/28/2021  89.1  69.4   Hematocrit 36.0 - 46.0 % 30.6  31.3  30.9   Platelets 150 - 400 K/uL 162  161  162        Latest Ref Rng & Units 07/26/2021    7:45 PM 12/20/2018   12:05 PM 04/21/2017    5:59 AM  CMP  Glucose 70 - 99 mg/dL 96  04/23/2017  99   BUN 6 - 20 mg/dL <5  8    Creatinine 888 - 1.00 mg/dL 2.80  0.34    Sodium 9.17 - 145 mmol/L 138  140    Potassium 3.5 - 5.1 mmol/L 3.6  3.8    Chloride 98 - 111 mmol/L 109  109    CO2 22 - 32 mmol/L 19  21    Calcium 8.9 - 10.3 mg/dL 9.4  9.0    Total Protein 6.5 - 8.1 g/dL 5.8  7.0    Total Bilirubin 0.3 - 1.2 mg/dL 0.6  0.5    Alkaline Phos 38 - 126 U/L 98  52    AST 15 - 41 U/L 23  15    ALT 0 - 44 U/L 14  13       Rh pos  Rub imm  Assessment/Plan: PPD #1, G6P6. SVD,  girl. 8'11" Formula feeds and doing well   CHTN - poor control. Change Labetalol to 200mg  BID (from 100 mg bid) and change Procardia to 30XL bid (from daily) - monitor today- likely discharge tomorrow if BPs better. Low dose Lasix 20mg  daily to prevent fluid overload . --> has office visit w/ 915 on 7/25 for BP check regardless of discharge date  DM II - Metformin now, stopped insulin-- CGBs good, continue  Depression- Buproprion 300mg  XL, - stable, continue Formula feeds   LOS: 2 days   , MD  07/28/2021, 10:13 AM

## 2021-07-28 NOTE — Progress Notes (Signed)
This nurse was able to reach Midwife Dorisann Frames, letting her know pt BP was 155/85 with a HR of 116 resting. Scheduled procardia and labetalol were given at scheduled time. This nurse wanted to make Midwife aware and stated would keep an eye on morning blood pressures.

## 2021-07-28 NOTE — Progress Notes (Signed)
RN entered the room for bedside report and mom was very tearful. Her edinburgh was a 44, she does have family support at the bedside and family agreed with mom that she needed help. This RN contacted Dorisann Frames, CNM and current plan is to have Dr. Conni Elliot come up and assess mom for current mood and potential medication changes.

## 2021-07-29 MED ORDER — LABETALOL HCL 200 MG PO TABS: 200.0000 mg | ORAL_TABLET | Freq: Two times a day (BID) | ORAL | 0 refills | Status: AC

## 2021-07-29 MED ORDER — FLUOXETINE HCL 20 MG PO TABS: 20.0000 mg | ORAL_TABLET | Freq: Every day | ORAL | 0 refills | Status: AC

## 2021-07-29 MED ORDER — ACETAMINOPHEN 500 MG PO TABS: 1000.0000 mg | ORAL_TABLET | Freq: Four times a day (QID) | ORAL | 0 refills | Status: AC

## 2021-07-29 MED ORDER — NIFEDIPINE ER 30 MG PO TB24: 30.0000 mg | ORAL_TABLET | Freq: Two times a day (BID) | ORAL | 0 refills | Status: AC

## 2021-07-29 MED ORDER — IBUPROFEN 600 MG PO TABS: 600.0000 mg | ORAL_TABLET | Freq: Four times a day (QID) | ORAL | 0 refills | Status: AC

## 2021-07-29 NOTE — Discharge Summary (Signed)
Postpartum Discharge Summary  Date of Service updated 07/29/21     Patient Name: Natalie [name] DOB: Jun 01, 1985 MRN: [id number]  Date of admission: 07/26/2021 Delivery date:07/27/2021  Delivering provider: Juliene Pina  Date of discharge: 07/29/2021  Admitting diagnosis: Encounter for induction of labor [Z34.90] Intrauterine pregnancy: [redacted]w[redacted]d    Secondary diagnosis:  Principal Problem:   Postpartum care following vaginal delivery 7/20 Active Problems:   SVD (spontaneous vaginal delivery)    Chronic hypertension in pregnancy   Pre-existing type 2 diabetes mellitus during pregnancy   Dependent edema   Encounter for induction of labor   Depression  Additional problems: N/A    Discharge diagnosis: Term Pregnancy Delivered, CHTN, and Type 2 DM                                              Post partum procedures: N/A Augmentation: AROM and Cytotec Complications: None  Hospital course: Induction of Labor With Vaginal Delivery   36y.o. yo [id number] admitted to the hospital 07/26/2021 for induction of labor.  Indication for induction: TYPE 2 DM and HTN and BMI and AMA .  Patient had an uncomplicated labor course as follows: Membrane Rupture Time/Date: 9:32 PM ,07/26/2021   Delivery Method:Vaginal, Spontaneous  Episiotomy: None  Lacerations:  None  Details of delivery can be found in separate delivery note.  Patient had a routine postpartum course. Patient is discharged home 07/29/21.  Newborn Data: Birth date:07/27/2021  Birth time:1:15 AM  Gender:Female  Living status:Living  Apgars:8 ,9  Weight:3940 g   Magnesium Sulfate received: No BMZ received: No Rhophylac:N/A MMR:N/A  Physical exam  Vitals:   07/28/21 1900 07/28/21 1933 07/28/21 2216 07/29/21 0551  BP:  130/60 (!) 155/85 121/68  Pulse:  (!) 107 (!) 116 (!) 105  Resp:  18 18 18   Temp:  98.1 F (36.7 C) 98 F (36.7 C) 98.5 F (36.9 C)  TempSrc:  Oral Oral Oral  SpO2: 98%     Weight:      Height:        General: alert and no distress Lochia: appropriate Uterine Fundus: firm Incision: N/A DVT Evaluation: No evidence of DVT seen on physical exam. Labs: Lab Results  Component Value Date   WBC 10.0 07/27/2021   HGB 10.2 (L) 07/27/2021   HCT 30.6 (L) 07/27/2021   MCV 85.2 07/27/2021   PLT 162 07/27/2021      Latest Ref Rng & Units 07/26/2021    7:45 PM  CMP  Glucose 70 - 99 mg/dL 96   BUN 6 - 20 mg/dL <5   Creatinine 0.44 - 1.00 mg/dL 0.62   Sodium 135 - 145 mmol/L 138   Potassium 3.5 - 5.1 mmol/L 3.6   Chloride 98 - 111 mmol/L 109   CO2 22 - 32 mmol/L 19   Calcium 8.9 - 10.3 mg/dL 9.4   Total Protein 6.5 - 8.1 g/dL 5.8   Total Bilirubin 0.3 - 1.2 mg/dL 0.6   Alkaline Phos 38 - 126 U/L 98   AST 15 - 41 U/L 23   ALT 0 - 44 U/L 14    Edinburgh Score:    07/28/2021    6:01 PM  Edinburgh Postnatal Depression Scale Screening Tool  I have been able to laugh and see the funny side of things. 2  I  have looked forward with enjoyment to things. 2  I have blamed myself unnecessarily when things went wrong. 0  I have been anxious or worried for no good reason. 2  I have felt scared or panicky for no good reason. 0  Things have been getting on top of me. 2  I have been so unhappy that I have had difficulty sleeping. 2  I have felt sad or miserable. 2  I have been so unhappy that I have been crying. 1  The thought of harming myself has occurred to me. 0  Edinburgh Postnatal Depression Scale Total 13      After visit meds:  Allergies as of 07/29/2021   No Known Allergies      Medication List     STOP taking these medications    aspirin 81 MG chewable tablet   Basaglar KwikPen 660 UNIT/ML   folic acid 0.5 MG tablet Commonly known as: FOLVITE   folic acid 1 MG tablet Commonly known as: FOLVITE       TAKE these medications    acetaminophen 500 MG tablet Commonly known as: TYLENOL Take 2 tablets (1,000 mg total) by mouth every 6 (six) hours.   buPROPion  300 MG 24 hr tablet Commonly known as: WELLBUTRIN XL Take 300 mg by mouth daily. What changed: Another medication with the same name was removed. Continue taking this medication, and follow the directions you see here.   FLUoxetine 20 MG tablet Commonly known as: PROZAC Take 1 tablet (20 mg total) by mouth daily.   ibuprofen 600 MG tablet Commonly known as: ADVIL Take 1 tablet (600 mg total) by mouth every 6 (six) hours.   labetalol 200 MG tablet Commonly known as: NORMODYNE Take 1 tablet (200 mg total) by mouth 2 (two) times daily. What changed:  medication strength how much to take   metFORMIN 500 MG tablet Commonly known as: GLUCOPHAGE Take 500 mg by mouth 2 (two) times daily with a meal.   NIFEdipine 30 MG 24 hr tablet Commonly known as: ADALAT CC Take 1 tablet (30 mg total) by mouth 2 (two) times daily.               Discharge Care Instructions  (From admission, onward)           Start     Ordered   07/29/21 0000  Discharge wound care:       Comments: Sitz baths 2 times /day with warm water x 1 week. May add herbals: 1 ounce dried comfrey leaf* 1 ounce calendula flowers 1 ounce lavender flowers  Supplies can be found online at [employer] sources at [employer], [employer]  1/2 ounce dried uva ursi leaves 1/2 ounce witch hazel blossoms (if you can find them) 1/2 ounce dried sage leaf 1/2 cup sea salt Directions: Bring 2 quarts of water to a boil. Turn off heat, and place 1 ounce (approximately 1 large handful) of the above mixed herbs (not the salt) into the pot. Steep, covered, for 30 minutes.  Strain the liquid well with a fine mesh strainer, and discard the herb material. Add 2 quarts of liquid to the tub, along with the 1/2 cup of salt. This medicinal liquid can also be made into compresses and peri-rinses.   07/29/21 1118             Discharge home in stable condition Infant Feeding: Bottle Infant Disposition:home with  mother Discharge instruction: per After Visit Summary and  Postpartum booklet. Activity: Advance as tolerated. Pelvic rest for 6 weeks.  Diet: carb modified diet Anticipated Birth Control: Unsure Postpartum Appointment:2-3 days Additional Postpartum F/U: Postpartum Depression checkup and BP check 2-3 days  Future Appointments: Future Appointments  Date Time Provider [place]  07/31/2021  9:00 AM [facility] None   Follow up Visit:  Patient discharged home on Labetalol 200 mg BID and ProcardiaXL 30 mg BID for cHTN Patient discharged home on Wellbutrin 300 mg daily and Prozac 20 mg daily for anxiety/depression history Scheduled for follow up with Derrell Lolling, CNM on Tuesday 7/25 for BP and PPD check    07/29/2021 Dois Juarbe A Lounell Schumacher, DO

## 2021-07-29 NOTE — Progress Notes (Signed)
This nurse educated mom about co-sleeping multiple times on shift, only to come back every time to see baby co-sleeping with mom. Mom was informed of accidents that can occur when co-sleeping with baby and informed this nurse that she understood.

## 2021-07-29 NOTE — Progress Notes (Signed)
CSW received consult for Edinburgh Postnatal Depression Scale score of 13. CSW met with MOB to offer support and complete assessment. When CSW entered room, FOB was present. MOB was observed sitting in hospital bed bonding with infant, "Carisa." CSW offered MOB privacy. MOB provided verbal consent for CSW to speak in front of FOB about anything. CSW introduced self and explained reason for consult. MOB was agreeable to consult and presented with a flat affect. MOB remained engaged throughout the encounter.   CSW inquired how MOB has felt emotionally since giving birth. MOB reports she is feeling "tired, emotional, and hormonal." MOB shared that she is currently taking wellbutrin to manage depression symptoms and reports "I thought it was working well until yesterday." MOB denied depressive symptoms during pregnancy until yesterday. CSW provided emotional support to MOB and inquired if anything significant happened yesterday. MOB reports she started crying and does not know why. MOB reports she spoke with her OBGYN about depressive symptoms who told MOB that her OBGYN would try a different psychotropic medication. MOB also reports feeling upset that she has to stay an extra day due to high blood pressure readings. CSW validated MOB's feelings and inquired if she is receiving any additional mental health treatment. MOB reports she does not attend therapy. CSW inquired if MOB is interested in mental health resources for outpatient therapy. MOB was agreeable to mental health resources and shared she may be interested in therapy, stating "I know I need therapy". 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 MOB identified FOB and her mother as supports. When asked about coping skills, MOB initially was unable to identify any but later shared that she puts on headphones and listens to music for 20 minutes. CSW encouraged MOB to take time for herself as she is able.  MOB inquired about MOB's mental  health history. MOB reports she was diagnosed with depression 5 years ago and reports she believes she experienced postpartum depression after all of her pregnancies. MOB shared with her 4th child, her postpartum depression "lasted a long time" and was triggered due to her child's FOB passing away during MOB's 4th pregnancy. CSW expressed condolences.   MOB denied current SI/HI. DV was not assessed due to FOB being present.   MOB reports she has all needed items for infant, including a car seat and bassinet. MOB has identified [facility] for infant care. MOB declined additional resource needs at this time.  CSW provided education regarding the baby blues period vs. perinatal mood disorders, discussed treatment and gave resources for mental health follow up if concerns arise.  CSW recommends self-evaluation during the postpartum time period using the New Mom Checklist from Postpartum Progress and encouraged MOB to contact a medical professional if symptoms are noted at any time.    MOB declined education regarding Sudden Infant Death Syndrome (SIDS) precautions.    CSW identifies no further need for intervention and no barriers to discharge at this time.  Signed,  Berniece Salines, MSW, LCSWA, [place] 07/29/2021 11:09 AM

## 2021-07-31 ENCOUNTER — Telehealth (HOSPITAL_COMMUNITY): Payer: Self-pay | Admitting: *Deleted

## 2021-07-31 ENCOUNTER — Encounter (HOSPITAL_COMMUNITY): Payer: Self-pay

## 2021-07-31 NOTE — Telephone Encounter (Signed)
In-patient EPDS score on 07/28/21 = 13. Score faxed to Arlan Organ, CNM. Deforest Hoyles, RN, 07/31/21, [phone].

## 2021-07-31 NOTE — Telephone Encounter (Signed)
Fax sent to Arlan Organ, CNM, informing provider of patient's inpatient EPDS score = 13 on 07/28/21. Deforest Hoyles, RN, 07/31/21, [phone]

## 2022-09-12 ENCOUNTER — Other Ambulatory Visit: Payer: Self-pay

## 2022-09-12 ENCOUNTER — Emergency Department (HOSPITAL_COMMUNITY)
Admission: EM | Admit: 2022-09-12 | Discharge: 2022-09-12 | Disposition: A | Discharge status: Home / Self Care | Payer: 59 | Attending: Emergency Medicine | Admitting: Emergency Medicine

## 2022-09-12 ENCOUNTER — Encounter (HOSPITAL_COMMUNITY): Payer: Self-pay

## 2022-09-12 DIAGNOSIS — R200 Anesthesia of skin: Secondary | ICD-10-CM | POA: Diagnosis present

## 2022-09-12 DIAGNOSIS — E876 Hypokalemia: Secondary | ICD-10-CM | POA: Insufficient documentation

## 2022-09-12 DIAGNOSIS — I10 Essential (primary) hypertension: Secondary | ICD-10-CM | POA: Insufficient documentation

## 2022-09-12 DIAGNOSIS — Z79899 Other long term (current) drug therapy: Secondary | ICD-10-CM | POA: Diagnosis not present

## 2022-09-12 LAB — COMPREHENSIVE METABOLIC PANEL
ALT: 15 U/L (ref 0–44)
AST: 20 U/L (ref 15–41)
Albumin: 4.3 g/dL (ref 3.5–5.0)
Alkaline Phosphatase: 51 U/L (ref 38–126)
Anion gap: 9 (ref 5–15)
BUN: 10 mg/dL (ref 6–20)
CO2: 25 mmol/L (ref 22–32)
Calcium: 9.2 mg/dL (ref 8.9–10.3)
Chloride: 103 mmol/L (ref 98–111)
Creatinine, Ser: 0.87 mg/dL (ref 0.44–1.00)
GFR, Estimated: >60 mL/min (ref >60)
Glucose, Bld: 82 mg/dL (ref 70–99)
Potassium: 3.3 mmol/L — ABNORMAL LOW (ref 3.5–5.1)
Sodium: 137 mmol/L (ref 135–145)
Total Bilirubin: 1.3 mg/dL — ABNORMAL HIGH (ref 0.3–1.2)
Total Protein: 7.7 g/dL (ref 6.5–8.1)

## 2022-09-12 LAB — CBC WITH DIFFERENTIAL/PLATELET
Abs Immature Granulocytes: 0.01 10*3/uL (ref 0.00–0.07)
Basophils Absolute: 0 10*3/uL (ref 0.0–0.1)
Basophils Relative: 0 %
Eosinophils Absolute: 0.1 10*3/uL (ref 0.0–0.5)
Eosinophils Relative: 1 %
HCT: 39.6 % (ref 36.0–46.0)
Hemoglobin: 13.3 g/dL (ref 12.0–15.0)
Immature Granulocytes: 0 %
Lymphocytes Relative: 53 %
Lymphs Abs: 2.4 10*3/uL (ref 0.7–4.0)
MCH: 28.2 pg (ref 26.0–34.0)
MCHC: 33.6 g/dL (ref 30.0–36.0)
MCV: 84.1 fL (ref 80.0–100.0)
Monocytes Absolute: 0.5 10*3/uL (ref 0.1–1.0)
Monocytes Relative: 10 %
Neutro Abs: 1.7 10*3/uL (ref 1.7–7.7)
Neutrophils Relative %: 36 %
Platelets: 194 10*3/uL (ref 150–400)
RBC: 4.71 MIL/uL (ref 3.87–5.11)
RDW: 14.1 % (ref 11.5–15.5)
WBC: 4.6 10*3/uL (ref 4.0–10.5)
nRBC: 0 % (ref 0.0–0.2)

## 2022-09-12 LAB — URINALYSIS, ROUTINE W REFLEX MICROSCOPIC
Bilirubin Urine: NEGATIVE
Glucose, UA: NEGATIVE mg/dL
Ketones, ur: NEGATIVE mg/dL
Nitrite: NEGATIVE
Protein, ur: 30 mg/dL — AB
Specific Gravity, Urine: 1.02 (ref 1.005–1.030)
pH: 6 (ref 5.0–8.0)

## 2022-09-12 LAB — PREGNANCY, URINE: Preg Test, Ur: NEGATIVE

## 2022-09-12 MED ORDER — PROCHLORPERAZINE EDISYLATE 10 MG/2ML IJ SOLN
10.0000 mg | Freq: Once | INTRAMUSCULAR | Status: AC
  Administered 2022-09-12: 10 mg via INTRAVENOUS
  Filled 2022-09-12: qty 2

## 2022-09-12 MED ORDER — SODIUM CHLORIDE 0.9 % IV BOLUS
1000.0000 mL | Freq: Once | INTRAVENOUS | Status: AC
  Administered 2022-09-12: 1000 mL via INTRAVENOUS

## 2022-09-12 MED ORDER — KETOROLAC TROMETHAMINE 30 MG/ML IJ SOLN
30.0000 mg | Freq: Once | INTRAMUSCULAR | Status: AC
  Administered 2022-09-12: 30 mg via INTRAVENOUS
  Filled 2022-09-12: qty 1

## 2022-09-12 NOTE — ED Triage Notes (Signed)
C/o headache, numbness, and tingling to left side of face and left arm with LKN 11am.  Denies blood thinner usage.

## 2022-09-12 NOTE — ED Notes (Signed)
EDP at bedside  

## 2022-09-12 NOTE — Discharge Instructions (Signed)
You were seen in the emergency department for headache and some numbness in your left cheek and left hand.  Your lab work showed your potassium was mildly low.  You were given a migraine cocktail with improvement in your symptoms.  Please follow-up with your regular doctor.  Return to the emergency department if any worsening or concerning symptoms.

## 2022-09-12 NOTE — ED Provider Notes (Signed)
[place] EMERGENCY DEPARTMENT AT [facility] Provider Note   CSN: [id number] Arrival date & time: 09/12/22  1610     History {Add pertinent medical, surgical, social history, OB history to HPI:1} Chief Complaint  Patient presents with   Numbness    Natalie [name] is a 37 y.o. female.  She has a history of hypertension.  She said she had a migraine headache today on the top of her head that was associated with some numbness in her left cheek to the corner of her mouth.  No difficulty speaking no other neurosymptoms.  She has never had any type of numbness with her headaches.  Today she said she has a 4 out of 10 headache and the numbness in her cheek is a little bit better but she has new numbness of her left hand from her wrist to her fingers.  No vision double vision no difficulty speaking or walking no clumsiness.  No prior history of same.  Fevers chills nausea vomiting diarrhea.  The history is provided by the patient.  Neurologic Problem This is a new problem. The current episode started 6 to 12 hours ago. The problem has not changed since onset.Associated symptoms include headaches. Pertinent negatives include no chest pain, no abdominal pain and no shortness of breath. Nothing aggravates the symptoms. Nothing relieves the symptoms. She has tried nothing for the symptoms. The treatment provided no relief.       Home Medications Prior to Admission medications   Medication Sig Start Date End Date Taking? Authorizing Provider  acetaminophen (TYLENOL) 500 MG tablet Take 2 tablets (1,000 mg total) by mouth every 6 (six) hours. 07/29/21   Law, Cassandra A, DO  buPROPion (WELLBUTRIN XL) 300 MG 24 hr tablet Take 300 mg by mouth daily.    [provider]  FLUoxetine (PROZAC) 20 MG tablet Take 1 tablet (20 mg total) by mouth daily. 07/29/21   Law, Cassandra A, DO  ibuprofen (ADVIL) 600 MG tablet Take 1 tablet (600 mg total) by mouth every 6 (six) hours. 07/29/21   Law,  Cassandra A, DO  labetalol (NORMODYNE) 200 MG tablet Take 1 tablet (200 mg total) by mouth 2 (two) times daily. 07/29/21   Law, Cassandra A, DO  metFORMIN (GLUCOPHAGE) 500 MG tablet Take 500 mg by mouth 2 (two) times daily with a meal.    [provider]  NIFEdipine (ADALAT CC) 30 MG 24 hr tablet Take 1 tablet (30 mg total) by mouth 2 (two) times daily. 07/29/21   Law, Cassandra A, DO      Allergies    Patient has no known allergies.    Review of Systems   Review of Systems  Constitutional:  Negative for fever.  HENT:  Negative for sore throat.   Eyes:  Negative for visual disturbance.  Respiratory:  Negative for shortness of breath.   Cardiovascular:  Negative for chest pain.  Gastrointestinal:  Negative for abdominal pain.  Genitourinary:  Negative for dysuria.  Musculoskeletal:  Negative for neck pain.  Skin:  Negative for rash.  Neurological:  Positive for numbness and headaches. Negative for speech difficulty and weakness.    Physical Exam Updated Vital Signs BP 129/89   Pulse (!) 102   Temp 99.2 F (37.3 C)   Resp 18   Ht 5\' 3"  (1.6 m)   Wt 136.1 kg   LMP 08/22/2022   SpO2 99%   BMI 53.14 kg/m  Physical Exam Vitals and nursing note reviewed.  Constitutional:  General: She is not in acute distress.    Appearance: Normal appearance. She is well-developed.  HENT:     Head: Normocephalic and atraumatic.  Eyes:     Conjunctiva/sclera: Conjunctivae normal.  Cardiovascular:     Rate and Rhythm: Normal rate and regular rhythm.     Heart sounds: No murmur heard. Pulmonary:     Effort: Pulmonary effort is normal. No respiratory distress.     Breath sounds: Normal breath sounds.  Abdominal:     Palpations: Abdomen is soft.     Tenderness: There is no abdominal tenderness. There is no guarding or rebound.  Musculoskeletal:        General: No swelling.     Cervical back: Neck supple.  Skin:    General: Skin is warm and dry.     Capillary Refill:  Capillary refill takes less than 2 seconds.  Neurological:     Mental Status: She is alert.     Cranial Nerves: No cranial nerve deficit.     Sensory: Sensory deficit present.     Motor: No weakness.     Gait: Gait normal.     Comments: She has decreased sensation over her left hand below the wrist.  Strength is 5 out of 5 cap refill brisk.  She also has some decreased sensation of her left cheek.     ED Results / Procedures / Treatments   Labs (all labs ordered are listed, but only abnormal results are displayed) Labs Reviewed  URINALYSIS, ROUTINE W REFLEX MICROSCOPIC  CBC WITH DIFFERENTIAL/PLATELET  COMPREHENSIVE METABOLIC PANEL  CBC WITH DIFFERENTIAL/PLATELET    EKG EKG Interpretation Date/Time:  Wednesday September 12 2022 16:27:05 EDT Ventricular Rate:  101 PR Interval:  122 QRS Duration:  76 QT Interval:  332 QTC Calculation: 430 R Axis:   70  Text Interpretation: Sinus tachycardia Nonspecific T wave abnormality Abnormal ECG No previous ECGs available Confirmed by Meridee Score [phone]) on 09/12/2022 4:28:54 PM  Radiology No results found.  Procedures Procedures  {Document cardiac monitor, telemetry assessment procedure when appropriate:1}  Medications Ordered in ED Medications - No data to display  ED Course/ Medical Decision Making/ A&P   {   Click here for ABCD2, HEART and other calculatorsREFRESH Note before signing :1}                              Medical Decision Making Amount and/or Complexity of Data Reviewed Labs: ordered.   This patient complains of ***; this involves an extensive number of treatment Options and is a complaint that carries with it a high risk of complications and morbidity. The differential includes ***  I ordered, reviewed and interpreted labs, which included *** I ordered medication *** and reviewed PMP when indicated. I ordered imaging studies which included *** and I independently    visualized and interpreted imaging  which showed *** Additional history obtained from *** Previous records obtained and reviewed *** I consulted *** and discussed lab and imaging findings and discussed disposition.  Cardiac monitoring reviewed, *** Social determinants considered, *** Critical Interventions: ***  After the interventions stated above, I reevaluated the patient and found *** Admission and further testing considered, ***   {Document critical care time when appropriate:1} {Document review of labs and clinical decision tools ie heart score, Chads2Vasc2 etc:1}  {Document your independent review of radiology images, and any outside records:1} {Document your discussion with family members, caretakers, and with consultants:1} {  Document social determinants of health affecting pt's care:1} {Document your decision making why or why not admission, treatments were needed:1} Final Clinical Impression(s) / ED Diagnoses Final diagnoses:  None    Rx / DC Orders ED Discharge Orders     None

## 2022-09-12 NOTE — ED Notes (Signed)
Pt called out to nurses station requested to be discharged. Pt now reports complete relief of s/s. Denies numbness/headache. EDP informed pt requesting discharge.

## 2023-06-12 ENCOUNTER — Telehealth: Admitting: Physician Assistant

## 2023-06-12 DIAGNOSIS — R3989 Other symptoms and signs involving the genitourinary system: Secondary | ICD-10-CM | POA: Diagnosis not present

## 2023-06-12 MED ORDER — NITROFURANTOIN MONOHYD MACRO 100 MG PO CAPS
100.0000 mg | ORAL_CAPSULE | Freq: Two times a day (BID) | ORAL | 0 refills | Status: AC
Start: 2023-06-12 — End: ?

## 2023-06-12 NOTE — Progress Notes (Signed)

## 2023-09-06 ENCOUNTER — Telehealth: Admitting: Family Medicine

## 2023-09-06 DIAGNOSIS — J329 Chronic sinusitis, unspecified: Secondary | ICD-10-CM | POA: Diagnosis not present

## 2023-09-06 DIAGNOSIS — B9789 Other viral agents as the cause of diseases classified elsewhere: Secondary | ICD-10-CM

## 2023-09-06 MED ORDER — FLUTICASONE PROPIONATE 50 MCG/ACT NA SUSP
2.0000 | Freq: Every day | NASAL | 6 refills | Status: AC
Start: 2023-09-06 — End: ?

## 2023-09-06 NOTE — Progress Notes (Signed)
 E-Visit for Sinus Problems  We are sorry that you are not feeling well.  Here is how we plan to help!  Based on what you have shared with me it looks like you have sinusitis.  Sinusitis is inflammation and infection in the sinus cavities of the head.  Based on your presentation I believe you most likely have Acute Viral Sinusitis.This is an infection most likely caused by a virus. There is not specific treatment for viral sinusitis other than to help you with the symptoms until the infection runs its course.  You may use an oral decongestant such as Mucinex  D or if you have glaucoma or high blood pressure use plain Mucinex . Saline nasal spray help and can safely be used as often as needed for congestion, I have prescribed: Fluticasone  nasal spray two sprays in each nostril once a day  Some authorities believe that zinc sprays or the use of Echinacea may shorten the course of your symptoms.  Sinus infections are not as easily transmitted as other respiratory infection, however we still recommend that you avoid close contact with loved ones, especially the very young and elderly.  Remember to wash your hands thoroughly throughout the day as this is the number one way to prevent the spread of infection!  Home Care: Only take medications as instructed by your medical team. Do not take these medications with alcohol. A steam or ultrasonic humidifier can help congestion.  You can place a towel over your head and breathe in the steam from hot water  coming from a faucet. Avoid close contacts especially the very young and the elderly. Cover your mouth when you cough or sneeze. Always remember to wash your hands.  Get Help Right Away If: You develop worsening fever or sinus pain. You develop a severe head ache or visual changes. Your symptoms persist after you have completed your treatment plan.  Make sure you Understand these instructions. Will watch your condition. Will get help right away if you  are not doing well or get worse.   Thank you for choosing an e-visit.  Your e-visit answers were reviewed by a board certified advanced clinical practitioner to complete your personal care plan. Depending upon the condition, your plan could have included both over the counter or prescription medications.  Please review your pharmacy choice. Make sure the pharmacy is open so you can pick up prescription now. If there is a problem, you may contact your provider through [employer] and have the prescription routed to another pharmacy.  Your safety is important to us . If you have drug allergies check your prescription carefully.   For the next 24 hours you can use MyChart to ask questions about today's visit, request a non-urgent call back, or ask for a work or school excuse. You will get an email in the next two days asking about your experience. I hope that your e-visit has been valuable and will speed your recovery.   have provided 5 minutes of non face to face time during this encounter for chart review and documentation.

## 2023-10-20 ENCOUNTER — Emergency Department (HOSPITAL_COMMUNITY)

## 2023-10-20 ENCOUNTER — Emergency Department (HOSPITAL_COMMUNITY)
Admission: EM | Admit: 2023-10-20 | Discharge: 2023-10-20 | Disposition: A | Discharge status: Home / Self Care | Attending: Emergency Medicine | Admitting: Emergency Medicine

## 2023-10-20 ENCOUNTER — Encounter (HOSPITAL_COMMUNITY): Payer: Self-pay | Admitting: *Deleted

## 2023-10-20 ENCOUNTER — Other Ambulatory Visit: Payer: Self-pay

## 2023-10-20 DIAGNOSIS — I10 Essential (primary) hypertension: Secondary | ICD-10-CM | POA: Insufficient documentation

## 2023-10-20 DIAGNOSIS — Z79899 Other long term (current) drug therapy: Secondary | ICD-10-CM | POA: Insufficient documentation

## 2023-10-20 DIAGNOSIS — R0789 Other chest pain: Secondary | ICD-10-CM | POA: Insufficient documentation

## 2023-10-20 DIAGNOSIS — Z7984 Long term (current) use of oral hypoglycemic drugs: Secondary | ICD-10-CM | POA: Diagnosis not present

## 2023-10-20 DIAGNOSIS — E119 Type 2 diabetes mellitus without complications: Secondary | ICD-10-CM | POA: Diagnosis not present

## 2023-10-20 DIAGNOSIS — R002 Palpitations: Secondary | ICD-10-CM | POA: Insufficient documentation

## 2023-10-20 DIAGNOSIS — R000 Tachycardia, unspecified: Secondary | ICD-10-CM | POA: Diagnosis not present

## 2023-10-20 LAB — BASIC METABOLIC PANEL WITH GFR
Anion gap: 13 (ref 5–15)
BUN: 12 mg/dL (ref 6–20)
CO2: 23 mmol/L (ref 22–32)
Calcium: 9.3 mg/dL (ref 8.9–10.3)
Chloride: 104 mmol/L (ref 98–111)
Creatinine, Ser: 0.92 mg/dL (ref 0.44–1.00)
GFR, Estimated: >60 mL/min (ref >60)
Glucose, Bld: 108 mg/dL — ABNORMAL HIGH (ref 70–99)
Potassium: 3.5 mmol/L (ref 3.5–5.1)
Sodium: 141 mmol/L (ref 135–145)

## 2023-10-20 LAB — CBC
HCT: 43.9 % (ref 36.0–46.0)
Hemoglobin: 15 g/dL (ref 12.0–15.0)
MCH: 28.6 pg (ref 26.0–34.0)
MCHC: 34.2 g/dL (ref 30.0–36.0)
MCV: 83.8 fL (ref 80.0–100.0)
Platelets: 189 K/uL (ref 150–400)
RBC: 5.24 MIL/uL — ABNORMAL HIGH (ref 3.87–5.11)
RDW: 14 % (ref 11.5–15.5)
WBC: 5.7 K/uL (ref 4.0–10.5)
nRBC: 0 % (ref 0.0–0.2)

## 2023-10-20 LAB — TSH: TSH: 1.07 u[IU]/mL (ref 0.350–4.500)

## 2023-10-20 LAB — MAGNESIUM: Magnesium: 1.9 mg/dL (ref 1.7–2.4)

## 2023-10-20 LAB — D-DIMER, QUANTITATIVE: D-Dimer, Quant: 0.42 ug{FEU}/mL (ref 0.00–0.50)

## 2023-10-20 LAB — PREGNANCY, URINE: Preg Test, Ur: NEGATIVE

## 2023-10-20 LAB — TROPONIN T, HIGH SENSITIVITY
Troponin T High Sensitivity: <15 ng/L (ref 0–19)
Troponin T High Sensitivity: <15 ng/L (ref 0–19)

## 2023-10-20 MED ORDER — LACTATED RINGERS IV BOLUS
1000.0000 mL | Freq: Once | INTRAVENOUS | Status: AC
  Administered 2023-10-20: 1000 mL via INTRAVENOUS

## 2023-10-20 MED ORDER — LORAZEPAM 1 MG PO TABS
1.0000 mg | ORAL_TABLET | Freq: Once | ORAL | Status: AC
  Administered 2023-10-20: 1 mg via ORAL
  Filled 2023-10-20: qty 1

## 2023-10-20 MED ORDER — SODIUM CHLORIDE 0.9 % IV BOLUS
1000.0000 mL | Freq: Once | INTRAVENOUS | Status: AC
  Administered 2023-10-20: 1000 mL via INTRAVENOUS

## 2023-10-20 NOTE — ED Provider Notes (Signed)
 [facility] EMERGENCY DEPARTMENT AT [facility] Provider Note   CSN: [id number] Arrival date & time: 10/20/23  1124     Patient presents with: Chest Pain   Natalie [name] is a 38 y.o. female.  She has history of hypertension diabetes and obesity.  Presents the ER today complaining of chest pain described as tightness in the center of her chest that started suddenly this morning with some intermittent mild tingling in the left hand.  She states she, as usual, had not eaten or drink anything aside from a sip of water, not any caffeine.  She states she had intercourse, got up to urinate, when she finished going to the bathroom, she walked across the hall and suddenly had chest tightness, palpitations and felt very lightheaded like she might pass out.  She sat down and was still feeling her fast heart rate decided to come to the ER for further evaluation, she denies any breath or pleurisy.  She never has in the past, no leg pain or swelling.  Patient reports she had multiple episodes of watery stool last night which is now resolved.  States that a fast heart rate and chest tightness feel like a panic attack, but she does not feel anxious or have any reason to be having a panic attack.     Chest Pain      Prior to Admission medications   Medication Sig Start Date End Date Taking? Authorizing Provider  acetaminophen  (TYLENOL ) 500 MG tablet Take 2 tablets (1,000 mg total) by mouth every 6 (six) hours. 07/29/21   Law, Cassandra A, DO  buPROPion  (WELLBUTRIN  XL) 300 MG 24 hr tablet Take 300 mg by mouth daily.    [provider]  FLUoxetine  (PROZAC ) 20 MG tablet Take 1 tablet (20 mg total) by mouth daily. 07/29/21   Law, Cassandra A, DO  fluticasone  (FLONASE ) 50 MCG/ACT nasal spray Place 2 sprays into both nostrils daily. 09/06/23   Blair, Diane W, FNP  ibuprofen  (ADVIL ) 600 MG tablet Take 1 tablet (600 mg total) by mouth every 6 (six) hours. 07/29/21   Law, Cassandra A, DO   labetalol  (NORMODYNE ) 200 MG tablet Take 1 tablet (200 mg total) by mouth 2 (two) times daily. 07/29/21   Law, Cassandra A, DO  metFORMIN  (GLUCOPHAGE ) 500 MG tablet Take 500 mg by mouth 2 (two) times daily with a meal.    [provider]  NIFEdipine  (ADALAT  CC) 30 MG 24 hr tablet Take 1 tablet (30 mg total) by mouth 2 (two) times daily. 07/29/21   Law, Cassandra A, DO  nitrofurantoin , macrocrystal-monohydrate, (MACROBID ) 100 MG capsule Take 1 capsule (100 mg total) by mouth 2 (two) times daily. 06/12/23   Burnette, Jennifer M, PA-C    Allergies: Patient has no known allergies.    Review of Systems  Cardiovascular:  Positive for chest pain.    Updated Vital Signs BP (!) 146/104   Pulse (!) 127   Temp 97.8 F (36.6 C) (Oral)   Resp 17   Ht 5' 3 (1.6 m)   Wt 111.1 kg   LMP 09/29/2023   SpO2 100%   BMI 43.40 kg/m   Physical Exam Vitals and nursing note reviewed.  Constitutional:      General: She is not in acute distress.    Appearance: She is well-developed.  HENT:     Head: Normocephalic and atraumatic.  Eyes:     Extraocular Movements: Extraocular movements intact.     Conjunctiva/sclera: Conjunctivae normal.  Pupils: Pupils are equal, round, and reactive to light.  Cardiovascular:     Rate and Rhythm: Regular rhythm. Tachycardia present.     Heart sounds: No murmur heard. Pulmonary:     Effort: Pulmonary effort is normal. No respiratory distress.     Breath sounds: Normal breath sounds.  Abdominal:     Palpations: Abdomen is soft.     Tenderness: There is no abdominal tenderness.  Musculoskeletal:        General: No swelling.     Cervical back: Neck supple.     Right lower leg: No tenderness. No edema.     Left lower leg: No tenderness. No edema.  Skin:    General: Skin is warm and dry.     Capillary Refill: Capillary refill takes less than 2 seconds.  Neurological:     General: No focal deficit present.     Mental Status: She is alert and oriented to  person, place, and time.  Psychiatric:        Mood and Affect: Mood normal.     (all labs ordered are listed, but only abnormal results are displayed) Labs Reviewed  BASIC METABOLIC PANEL WITH GFR  CBC  D-DIMER, QUANTITATIVE  MAGNESIUM  TROPONIN T, HIGH SENSITIVITY    EKG: EKG Interpretation Date/Time:  Sunday October 20 2023 11:32:15 EDT Ventricular Rate:  127 PR Interval:  80 QRS Duration:  75 QT Interval:  391 QTC Calculation: 569 R Axis:   71  Text Interpretation: Sinus tachycardia Ventricular premature complex Low voltage, precordial leads Borderline T abnormalities, diffuse leads Prolonged QT interval Confirmed by Towana Sharper [phone]) on 10/20/2023 11:35:08 AM  Radiology: No results found.   Procedures   Medications Ordered in the ED  sodium chloride  0.9 % bolus 1,000 mL (has no administration in time range)                                    Medical Decision Making This patient presents to the ED for concern of palpitations and chest pain, this involves an extensive number of treatment options, and is a complaint that carries with it a high risk of complications and morbidity.  The differential diagnosis includes ACS, PE, pneumonia, pneumothorax, dehydration, hyperthyroidism, other   Co morbidities that complicate the patient evaluation  Hypertension, diabetes   Additional history obtained:  Additional history obtained from EMR External records from outside source obtained and reviewed including ER notes and labs   Lab Tests:  I Ordered, and personally interpreted labs.  The pertinent results include: Troponin is negative x 2, pregnancy negative, magnesium normal, D-dimer negative, CBC and BMP normal TSH is normal   Imaging Studies ordered:  I ordered imaging studies including x-ray chest I independently visualized and interpreted imaging which showed pulmonary edema or infiltrate, no pneumothorax I agree with the radiologist  interpretation     Problem List / ED Course / Critical interventions / Medication management  Suddenly had palpitations and felt anxious and had some tightness in her chest today though did not have a reason to feel anxious.  She denies any stimulant or drug use, she has some chest tightness which is now resolved, she does report she had a lot of diarrhea last night which is also now resolved.  Her workup was overall very reassuring she got IV fluids and after first liter was still very tachycardic, after second liter her heart rate is  trending down.  She had previously been on labetalol  until about 5 months ago when they stopped it because her blood pressures were low.  Patient's prior notes showed that she has had borderline high heart rates in the past.  Discussed with patient but workup was reassuring but she still is mildly tachycardic, I have no reason to keep her here, no sign of PE, thyroid storm or even hyperthyroidism, no signs of infection and she is feeling well and is well-appearing.  We have placed consult for her to follow-up closely with cardiology.  We considered starting beta-blocker but as her blood pressures are somewhat low feel there is a chance to do more harm than good so we are now holding off on this.  She is agree with plan of care and discharge is feeling better than when she came in.  Her heart rate has been trending down.  She is not feeling dizzy.  Discussed with Dr. Towana as well while patient was in the ED.  I have reviewed the patients home medicines and have made adjustments as needed    Test / Admission - Considered:  CTA chest but patient has no chest pain, no shortness of breath, no pleurisy, no lower extremity swelling or pain, her D-dimer is negative I do not feel benefits outweigh risk.    Amount and/or Complexity of Data Reviewed Labs: ordered. Radiology: ordered.  Risk Prescription drug management.        Final diagnoses:  None    ED  Discharge Orders     None          Suellen Sherran DELENA DEVONNA 10/20/23 1929    Towana Ozell BROCKS, MD 10/21/23 [phone]

## 2023-10-20 NOTE — Discharge Instructions (Signed)
 Pleasure take care of you today.  You are seen today for chest pain and fast heart rate.  This seems to be getting better after IV fluids though is not completely back to normal.  It could be somewhat related to the fact that you had been taken off of your labetalol  which can affect your heart rate, and your heart rate seems to tend hide based on your previous records.  Fortunately your lab work was all normal including cardiac enzymes, electrolytes, and D-dimer which is a test for possible blood clot in the lung.  Drink plenty of fluids and rest.  Come back to the ER if you have new or worsening symptoms, otherwise follow-up with cardiology.  I have placed a referral to help expedite this.  Avoid any stimulants such as nicotine, caffeine, cold medications.

## 2023-10-20 NOTE — ED Triage Notes (Signed)
 Pt with diarrhea last night, denies any today. Pt c/o mid chest tightness this morning and dizziness, pt states she felt like she was going to pass out. Pt states it felt like a panic attack and noted her HR 122

## 2023-11-07 ENCOUNTER — Encounter: Payer: Self-pay | Admitting: Physician Assistant
